# Patient Record
Sex: Male | Born: 1965
Health system: Southern US, Community
[De-identification: ages and names within clinical notes are randomized; demographics above are authoritative.]

## PROBLEM LIST (undated history)

## (undated) DIAGNOSIS — Z8619 Personal history of other infectious and parasitic diseases: Secondary | ICD-10-CM

## (undated) DIAGNOSIS — T7840XA Allergy, unspecified, initial encounter: Secondary | ICD-10-CM

## (undated) HISTORY — DX: Allergy, unspecified, initial encounter: T78.40XA

## (undated) HISTORY — DX: Personal history of other infectious and parasitic diseases: Z86.19

## (undated) HISTORY — PX: TONSILLECTOMY AND ADENOIDECTOMY: SHX28

---

## 2010-01-01 ENCOUNTER — Encounter: Admission: RE | Admit: 2010-01-01 | Discharge: 2010-01-01 | Payer: Self-pay | Admitting: Family Medicine

## 2012-03-12 DIAGNOSIS — C4491 Basal cell carcinoma of skin, unspecified: Secondary | ICD-10-CM | POA: Insufficient documentation

## 2013-07-28 ENCOUNTER — Other Ambulatory Visit: Payer: Self-pay | Admitting: Family Medicine

## 2013-07-28 ENCOUNTER — Ambulatory Visit
Admission: RE | Admit: 2013-07-28 | Discharge: 2013-07-28 | Disposition: A | Discharge status: Home / Self Care | Payer: Self-pay | Source: Ambulatory Visit | Attending: Family Medicine | Admitting: Family Medicine

## 2013-07-28 DIAGNOSIS — T148XXA Other injury of unspecified body region, initial encounter: Secondary | ICD-10-CM

## 2013-07-28 DIAGNOSIS — R52 Pain, unspecified: Secondary | ICD-10-CM

## 2013-07-28 DIAGNOSIS — R609 Edema, unspecified: Secondary | ICD-10-CM

## 2013-11-24 ENCOUNTER — Observation Stay (HOSPITAL_COMMUNITY)
Admission: EM | Admit: 2013-11-24 | Discharge: 2013-11-25 | Disposition: A | Discharge status: Home / Self Care | Payer: BC Managed Care – PPO | Attending: Internal Medicine | Admitting: Internal Medicine

## 2013-11-24 ENCOUNTER — Encounter (HOSPITAL_COMMUNITY): Payer: Self-pay | Admitting: Emergency Medicine

## 2013-11-24 ENCOUNTER — Emergency Department (HOSPITAL_COMMUNITY): Payer: BC Managed Care – PPO

## 2013-11-24 DIAGNOSIS — F1210 Cannabis abuse, uncomplicated: Secondary | ICD-10-CM | POA: Insufficient documentation

## 2013-11-24 DIAGNOSIS — S0081XA Abrasion of other part of head, initial encounter: Secondary | ICD-10-CM | POA: Diagnosis present

## 2013-11-24 DIAGNOSIS — S0003XA Contusion of scalp, initial encounter: Secondary | ICD-10-CM | POA: Insufficient documentation

## 2013-11-24 DIAGNOSIS — Y9389 Activity, other specified: Secondary | ICD-10-CM | POA: Insufficient documentation

## 2013-11-24 DIAGNOSIS — Z87891 Personal history of nicotine dependence: Secondary | ICD-10-CM | POA: Insufficient documentation

## 2013-11-24 DIAGNOSIS — S0083XA Contusion of other part of head, initial encounter: Secondary | ICD-10-CM

## 2013-11-24 DIAGNOSIS — R55 Syncope and collapse: Principal | ICD-10-CM | POA: Diagnosis present

## 2013-11-24 DIAGNOSIS — S1093XA Contusion of unspecified part of neck, initial encounter: Secondary | ICD-10-CM

## 2013-11-24 DIAGNOSIS — W1809XA Striking against other object with subsequent fall, initial encounter: Secondary | ICD-10-CM | POA: Insufficient documentation

## 2013-11-24 DIAGNOSIS — Y92009 Unspecified place in unspecified non-institutional (private) residence as the place of occurrence of the external cause: Secondary | ICD-10-CM | POA: Insufficient documentation

## 2013-11-24 DIAGNOSIS — IMO0002 Reserved for concepts with insufficient information to code with codable children: Secondary | ICD-10-CM | POA: Insufficient documentation

## 2013-11-24 LAB — CBC WITH DIFFERENTIAL/PLATELET
BASOS ABS: 0 10*3/uL (ref 0.0–0.1)
Basophils Relative: 0 % (ref 0–1)
EOS PCT: 3 % (ref 0–5)
Eosinophils Absolute: 0.3 10*3/uL (ref 0.0–0.7)
HEMATOCRIT: 39.2 % (ref 39.0–52.0)
HEMOGLOBIN: 13.6 g/dL (ref 13.0–17.0)
LYMPHS ABS: 2.1 10*3/uL (ref 0.7–4.0)
Lymphocytes Relative: 22 % (ref 12–46)
MCH: 30.9 pg (ref 26.0–34.0)
MCHC: 34.7 g/dL (ref 30.0–36.0)
MCV: 89.1 fL (ref 78.0–100.0)
MONO ABS: 0.8 10*3/uL (ref 0.1–1.0)
MONOS PCT: 8 % (ref 3–12)
NEUTROS ABS: 6.5 10*3/uL (ref 1.7–7.7)
Neutrophils Relative %: 67 % (ref 43–77)
PLATELETS: 235 10*3/uL (ref 150–400)
RBC: 4.4 MIL/uL (ref 4.22–5.81)
RDW: 13.1 % (ref 11.5–15.5)
WBC: 9.7 10*3/uL (ref 4.0–10.5)

## 2013-11-24 LAB — COMPREHENSIVE METABOLIC PANEL
ALK PHOS: 43 U/L (ref 39–117)
ALT: 16 U/L (ref 0–53)
AST: 19 U/L (ref 0–37)
Albumin: 3.7 g/dL (ref 3.5–5.2)
BILIRUBIN TOTAL: 0.5 mg/dL (ref 0.3–1.2)
BUN: 13 mg/dL (ref 6–23)
CALCIUM: 9 mg/dL (ref 8.4–10.5)
CO2: 23 mEq/L (ref 19–32)
CREATININE: 1.09 mg/dL (ref 0.50–1.35)
Chloride: 99 mEq/L (ref 96–112)
GFR calc non Af Amer: 78 mL/min — ABNORMAL LOW (ref >90)
Glucose, Bld: 125 mg/dL — ABNORMAL HIGH (ref 70–99)
POTASSIUM: 3.5 meq/L — AB (ref 3.7–5.3)
Sodium: 140 mEq/L (ref 137–147)
TOTAL PROTEIN: 6 g/dL (ref 6.0–8.3)

## 2013-11-24 LAB — CBG MONITORING, ED: Glucose-Capillary: 102 mg/dL — ABNORMAL HIGH (ref 70–99)

## 2013-11-24 LAB — ETHANOL: Alcohol, Ethyl (B): 63 mg/dL — ABNORMAL HIGH (ref 0–11)

## 2013-11-24 MED ORDER — SODIUM CHLORIDE 0.9 % IV SOLN: 1000.0000 mL | INTRAVENOUS | Status: DC

## 2013-11-24 MED ORDER — SODIUM CHLORIDE 0.9 % IV SOLN
1000.0000 mL | Freq: Once | INTRAVENOUS | Status: AC
  Administered 2013-11-24: 1000 mL via INTRAVENOUS

## 2013-11-24 NOTE — ED Notes (Signed)
Per EMS from home with c/o near syncope versus syncopal episode.  Pt was going to bathroom and fell at home.  Pt sts he may have been out for about a couple of seconds.  Pt hit head when he fell; hematoma above 2 left eye.  Pt was diaphoretic and pale on EMS arrival.  Pt was noted to be orthostatic.  Pt given 500 cc bolus.  VS currently stable.  Pt not complaining of any pain on arrival.

## 2013-11-24 NOTE — ED Provider Notes (Signed)
CSN: [id number]     Arrival date & time 11/24/13  2229 History   First MD Initiated Contact with Patient 11/24/13 2253     Chief Complaint  Patient presents with  . Loss of Consciousness     (Consider location/radiation/quality/duration/timing/severity/associated sxs/prior Treatment) HPI 48 year old male presents to emergency room via EMS from home after a syncopal episode.  Patient reports he has not eaten much today, had a banana for breakfast, herbal tea throughout the day.  He went out for dinner, had his normal meal and had 3 pints of beer.  Upon coming home, he reports he "felt bad.".  He walked to the bathroom to have a bowel movement.  Patient became dizzy and lightheaded and passed out.  Patient vomited after episode of emesis.  Wife reports when she heard him fall, he was unresponsive gray and diaphoretic.  She reports it took several minutes before he was acting normally again.  Patient struck the left side of his face.  He is denying any headache.  Patient denies any significant medical history, no family history, no medications.  He and his wife had a viral syndrome on and off for last 2 weeks.  EMS reports patient was orthostatic upon their arrival, he was given a 500 cc bolus.  History reviewed. No pertinent past medical history. History reviewed. No pertinent past surgical history. History reviewed. No pertinent family history. History  Substance Use Topics  . Smoking status: Former Research scientist (life sciences)  . Smokeless tobacco: Not on file  . Alcohol Use: Yes    Review of Systems  See History of Present Illness; otherwise all other systems are reviewed and negative   Allergies  Review of patient's allergies indicates no known allergies.  Home Medications   Current Outpatient Rx  Name  Route  Sig  Dispense  Refill  . acetaminophen (TYLENOL) 325 MG tablet   Oral   Take 650 mg by mouth every 6 (six) hours as needed.          BP 107/71  Pulse 77 Physical Exam  Nursing note and  vitals reviewed. Constitutional: He is oriented to person, place, and time. He appears well-developed and well-nourished. No distress.  HENT:  Head: Normocephalic and atraumatic.  Right Ear: External ear normal.  Left Ear: External ear normal.  Nose: Nose normal.  Mouth/Throat: Oropharynx is clear and moist.  Contusion below left eyebrow, abrasions to left face  Eyes: Conjunctivae and EOM are normal. Pupils are equal, round, and reactive to light.  Neck: Normal range of motion. Neck supple. No JVD present. No tracheal deviation present. No thyromegaly present.  Cardiovascular: Normal rate, regular rhythm, normal heart sounds and intact distal pulses.  Exam reveals no gallop and no friction rub.   No murmur heard. Pulmonary/Chest: Effort normal and breath sounds normal. No stridor. No respiratory distress. He has no wheezes. He has no rales. He exhibits no tenderness.  Abdominal: Soft. Bowel sounds are normal. He exhibits no distension and no mass. There is no tenderness. There is no rebound and no guarding.  Musculoskeletal: Normal range of motion. He exhibits no edema and no tenderness.  Lymphadenopathy:    He has no cervical adenopathy.  Neurological: He is alert and oriented to person, place, and time. He has normal reflexes. No cranial nerve deficit. He exhibits normal muscle tone. Coordination normal.  Skin: Skin is warm and dry. No rash noted. No erythema. No pallor.  Psychiatric: He has a normal mood and affect. His behavior is normal.  Judgment and thought content normal.    ED Course  Procedures (including critical care time) Labs Review Labs Reviewed  COMPREHENSIVE METABOLIC PANEL - Abnormal; Notable for the following:    Potassium 3.5 (*)    Glucose, Bld 125 (*)    GFR calc non Af Amer 78 (*)    All other components within normal limits  ETHANOL - Abnormal; Notable for the following:    Alcohol, Ethyl (B) 63 (*)    All other components within normal limits  CBG  MONITORING, ED - Abnormal; Notable for the following:    Glucose-Capillary 102 (*)    All other components within normal limits  CBC WITH DIFFERENTIAL  URINALYSIS, ROUTINE W REFLEX MICROSCOPIC  POCT CBG (FASTING - GLUCOSE)-MANUAL ENTRY   Imaging Review Ct Head Wo Contrast  11/25/2013   CLINICAL DATA:  Syncope.  Head injury after fall.  EXAM: CT HEAD WITHOUT CONTRAST  TECHNIQUE: Contiguous axial images were obtained from the base of the skull through the vertex without intravenous contrast.  COMPARISON:  None.  FINDINGS: No mass effect or midline shift is noted. Ventricular size is within normal limits. There is no evidence of mass lesion, hemorrhage or acute infarction. Bony calvarium is intact.  IMPRESSION: No gross intracranial abnormality seen.   Electronically Signed   By: Sabino Dick M.D.   On: 11/25/2013 00:28     EKG Interpretation   Date/Time:  Thursday November 24 2013 22:38:15 EDT Ventricular Rate:  78 PR Interval:  162 QRS Duration: 94 QT Interval:  396 QTC Calculation: 451 R Axis:   90 Text Interpretation:  Sinus rhythm Borderline right axis deviation ST  elev, probable normal early repol pattern No old tracing to compare  Confirmed by Mekaylah Klich  MD, Christol Thetford ([id number]) on 11/24/2013 10:59:07 PM      MDM   Final diagnoses:  Syncope    48 year old male with syncopal episode, minor head injury.  Symptoms seem concerning for possible arrhythmia, differential also includes vasovagal syncope.  Labs, head CT, EKG ordered.    Kalman Drape, MD 11/25/13 [phone]

## 2013-11-25 DIAGNOSIS — IMO0002 Reserved for concepts with insufficient information to code with codable children: Secondary | ICD-10-CM

## 2013-11-25 DIAGNOSIS — S0081XA Abrasion of other part of head, initial encounter: Secondary | ICD-10-CM | POA: Diagnosis present

## 2013-11-25 DIAGNOSIS — R55 Syncope and collapse: Secondary | ICD-10-CM

## 2013-11-25 DIAGNOSIS — I517 Cardiomegaly: Secondary | ICD-10-CM

## 2013-11-25 LAB — PROTIME-INR
INR: 0.98 (ref 0.00–1.49)
PROTHROMBIN TIME: 12.8 s (ref 11.6–15.2)

## 2013-11-25 LAB — URINALYSIS, ROUTINE W REFLEX MICROSCOPIC
Bilirubin Urine: NEGATIVE
Glucose, UA: NEGATIVE mg/dL
Hgb urine dipstick: NEGATIVE
Ketones, ur: NEGATIVE mg/dL
LEUKOCYTES UA: NEGATIVE
NITRITE: NEGATIVE
Protein, ur: NEGATIVE mg/dL
Specific Gravity, Urine: 1.005 (ref 1.005–1.030)
UROBILINOGEN UA: 0.2 mg/dL (ref 0.0–1.0)
pH: 6.5 (ref 5.0–8.0)

## 2013-11-25 LAB — CBC
HCT: 38.6 % — ABNORMAL LOW (ref 39.0–52.0)
Hemoglobin: 13.1 g/dL (ref 13.0–17.0)
MCH: 30.4 pg (ref 26.0–34.0)
MCHC: 33.9 g/dL (ref 30.0–36.0)
MCV: 89.6 fL (ref 78.0–100.0)
PLATELETS: 247 10*3/uL (ref 150–400)
RBC: 4.31 MIL/uL (ref 4.22–5.81)
RDW: 13 % (ref 11.5–15.5)
WBC: 8.7 10*3/uL (ref 4.0–10.5)

## 2013-11-25 LAB — RAPID URINE DRUG SCREEN, HOSP PERFORMED
AMPHETAMINES: NOT DETECTED
BARBITURATES: NOT DETECTED
BENZODIAZEPINES: NOT DETECTED
COCAINE: NOT DETECTED
OPIATES: NOT DETECTED
TETRAHYDROCANNABINOL: POSITIVE — AB

## 2013-11-25 LAB — TROPONIN I

## 2013-11-25 LAB — COMPREHENSIVE METABOLIC PANEL
ALK PHOS: 48 U/L (ref 39–117)
ALT: 15 U/L (ref 0–53)
AST: 17 U/L (ref 0–37)
Albumin: 3.6 g/dL (ref 3.5–5.2)
BILIRUBIN TOTAL: 0.3 mg/dL (ref 0.3–1.2)
BUN: 11 mg/dL (ref 6–23)
CHLORIDE: 107 meq/L (ref 96–112)
CO2: 22 meq/L (ref 19–32)
CREATININE: 0.98 mg/dL (ref 0.50–1.35)
Calcium: 8.7 mg/dL (ref 8.4–10.5)
GLUCOSE: 120 mg/dL — AB (ref 70–99)
POTASSIUM: 4.2 meq/L (ref 3.7–5.3)
Sodium: 143 mEq/L (ref 137–147)
Total Protein: 5.8 g/dL — ABNORMAL LOW (ref 6.0–8.3)

## 2013-11-25 MED ORDER — ENOXAPARIN SODIUM 40 MG/0.4ML ~~LOC~~ SOLN
40.0000 mg | SUBCUTANEOUS | Status: DC
Start: 2013-11-25 — End: 2013-11-25
  Administered 2013-11-25: 40 mg via SUBCUTANEOUS
  Filled 2013-11-25: qty 0.4

## 2013-11-25 MED ORDER — SODIUM CHLORIDE 0.9 % IV SOLN
INTRAVENOUS | Status: DC
  Administered 2013-11-25: 05:00:00 via INTRAVENOUS

## 2013-11-25 MED ORDER — ONDANSETRON HCL 4 MG/2ML IJ SOLN: 4.0000 mg | Freq: Four times a day (QID) | INTRAMUSCULAR | Status: DC | PRN

## 2013-11-25 MED ORDER — ACETAMINOPHEN 325 MG PO TABS: 650.0000 mg | ORAL_TABLET | Freq: Four times a day (QID) | ORAL | Status: DC | PRN

## 2013-11-25 MED ORDER — SODIUM CHLORIDE 0.9 % IJ SOLN
3.0000 mL | Freq: Two times a day (BID) | INTRAMUSCULAR | Status: DC
  Administered 2013-11-25: 3 mL via INTRAVENOUS

## 2013-11-25 MED ORDER — ACETAMINOPHEN 650 MG RE SUPP: 650.0000 mg | Freq: Four times a day (QID) | RECTAL | Status: DC | PRN

## 2013-11-25 MED ORDER — ONDANSETRON HCL 4 MG PO TABS: 4.0000 mg | ORAL_TABLET | Freq: Four times a day (QID) | ORAL | Status: DC | PRN

## 2013-11-25 NOTE — Progress Notes (Signed)
UR completed 

## 2013-11-25 NOTE — Progress Notes (Signed)
  Echocardiogram 2D Echocardiogram has been performed.  Diamond Nickel 11/25/2013, 12:15 PM

## 2013-11-25 NOTE — Discharge Summary (Signed)
Physician Discharge Summary  Blake Brown RWE:[id number] DOB: 1966/08/07 DOA: 11/24/2013  PCP: Rachell Cipro, MD  Admit date: 11/24/2013 Discharge date: 11/25/2013  Discharge Diagnoses:  Principal Problem:   Syncope, likely vasovagal Active Problems: Face contusion.  Discharge Condition: stable.  Filed Weights   11/25/13 0217 11/25/13 0449  Weight: 98.431 kg (217 lb) 98.431 kg (217 lb)    History of present illness:  48 y.o. male with no significant Past medical history.  The patient is coming from home.  The patient presented with an episode of syncope. He mentions he hadn't had anything to eat during the day and later on in the night he went for dinner, he had 3 beer and after eating went to the restroom for a bowel movement, after that when he tried to stand up he felt dizzy and fell on the ground, he does not remember the fall he found himself on the ground and he had an episode of vomiting. He denies any prior fever, chills, headache, cough, chest pain, palpitation, shortness of breath, orthopnea, PND, nausea, vomiting, abdominal pain, diarrhea, constipation, active bleeding, burning urination, dizziness, pedal edema, focal neurological deficit.  He does not have the symptoms at present.  He had injury to his eye on the left and cheek. He denies any blurring of the vision.  Hospital Course:  Observed on telemetry. Given IVF. Echo showed nothing concerning. Likely vasovagal syncope  Procedures:  none  Consultations:  none  Discharge Exam: Filed Vitals:   11/25/13 1320  BP: 129/76  Pulse: 71  Temp: 97.4 F (36.3 C)  Resp: 20    General: alert, oriented, cooperative Cardiovascular: RRR Respiratory: CTA Ext no CCE  Discharge Instructions  Discharge Orders   Future Orders Complete By Expires   Activity as tolerated - No restrictions  As directed    Diet general  As directed        Medication List         acetaminophen 325 MG tablet  Commonly known as:   TYLENOL  Take 650 mg by mouth every 6 (six) hours as needed.       No Known Allergies     Follow-up Information   Follow up with [facility], MD. (As needed)    Specialty:  Family Medicine   Contact information:   [place] [id number] [phone]        The results of significant diagnostics from this hospitalization (including imaging, microbiology, ancillary and laboratory) are listed below for reference.    Significant Diagnostic Studies: Ct Head Wo Contrast  11/25/2013   CLINICAL DATA:  Syncope.  Head injury after fall.  EXAM: CT HEAD WITHOUT CONTRAST  TECHNIQUE: Contiguous axial images were obtained from the base of the skull through the vertex without intravenous contrast.  COMPARISON:  None.  FINDINGS: No mass effect or midline shift is noted. Ventricular size is within normal limits. There is no evidence of mass lesion, hemorrhage or acute infarction. Bony calvarium is intact.  IMPRESSION: No gross intracranial abnormality seen.   Electronically Signed   By: Sabino Dick M.D.   On: 11/25/2013 00:28   EKG NSR  Echo Left ventricle: The cavity size was normal. Wall thickness was increased in a pattern of mild LVH. Systolic function was normal. The estimated ejection fraction was in the range of 60% to 65%. Wall motion was normal; there were no regional wall motion abnormalities. Doppler parameters are consistent with abnormal left ventricular relaxation (grade 1 diastolic  dysfunction). - Right atrium: Central venous pressure: 24mm Hg (est). - Atrial septum: No defect or patent foramen ovale was identified. - Pulmonary arteries: Systolic pressure could not be accurately estimated. - Pericardium, extracardiac: There was no pericardial effusion. Impressions:  - Mild LVH with LVEF 02-77%, grade 1 diastolic dysfunction. No significant valvular abnormalities. Unable to assess PASP. No pericardial effusion.  Microbiology: No results found  for this or any previous visit (from the past 240 hour(s)).   Labs: Basic Metabolic Panel:  Recent Labs Lab 11/24/13 2244 11/25/13 0451  NA 140 143  K 3.5* 4.2  CL 99 107  CO2 23 22  GLUCOSE 125* 120*  BUN 13 11  CREATININE 1.09 0.98  CALCIUM 9.0 8.7   Liver Function Tests:  Recent Labs Lab 11/24/13 2244 11/25/13 0451  AST 19 17  ALT 16 15  ALKPHOS 43 48  BILITOT 0.5 0.3  PROT 6.0 5.8*  ALBUMIN 3.7 3.6   No results found for this basename: LIPASE, AMYLASE,  in the last 168 hours No results found for this basename: AMMONIA,  in the last 168 hours CBC:  Recent Labs Lab 11/24/13 2244 11/25/13 0451  WBC 9.7 8.7  NEUTROABS 6.5  --   HGB 13.6 13.1  HCT 39.2 38.6*  MCV 89.1 89.6  PLT 235 247   Cardiac Enzymes:  Recent Labs Lab 11/25/13 0130 11/25/13 0920  TROPONINI <0.30 <0.30   BNP: BNP (last 3 results) No results found for this basename: PROBNP,  in the last 8760 hours CBG:  Recent Labs Lab 11/24/13 2300  GLUCAP 102*       Signed:  [place] L  [facility] 11/25/2013, 2:33 PM

## 2013-11-25 NOTE — Progress Notes (Signed)
Discharge review done with patient.   Patient acknowledged understanding of information provided. Patient is stable and discharged home with family member. Blake Brown  

## 2013-11-25 NOTE — H&P (Signed)
[facility] History and Physical  Patient: Blake Brown  EVO:[id number]  DOB: Jul 09, 1966  DOS: the patient was seen and examined on 11/25/2013 PCP: Rachell Cipro, MD  Chief Complaint: fall  HPI: Blake Brown is a 48 y.o. male with no significant Past medical history. The patient is coming from home. The patient presented with an episode of syncope. He mentions he hadn't had anything to eat during the day and later on in the night he went for dinner, he had 3 beer and after eating went to the restroom for a bowel movement, after that when he tried to stand up he felt dizzy and fell on the ground, he does not remember the fall he found himself on the ground and he had an episode of vomiting. He denies any prior fever, chills, headache, cough, chest pain, palpitation, shortness of breath, orthopnea, PND, nausea, vomiting, abdominal pain, diarrhea, constipation, active bleeding, burning urination, dizziness, pedal edema,  focal neurological deficit.  He does not have the symptoms at present. He had injury to his eye on the left and cheek. He denies any blurring of the vision.  Review of Systems: as mentioned in the history of present illness.  A Comprehensive review of the other systems is negative.  History reviewed. No pertinent past medical history. History reviewed. No pertinent past surgical history. Social History:  reports that he has quit smoking. He does not have any smokeless tobacco history on file. He reports that he drinks alcohol. His drug history is not on file. Independent for most of his  ADL.  No Known Allergies  History reviewed. No pertinent family history.  Prior to Admission medications   Medication Sig Start Date End Date Taking? Authorizing Provider  acetaminophen (TYLENOL) 325 MG tablet Take 650 mg by mouth every 6 (six) hours as needed.   Yes Historical Provider, MD    Physical Exam: Filed Vitals:   11/24/13 2254 11/24/13 2256 11/24/13 2257 11/25/13  0217  BP: 94/55 100/65 107/71 126/71  Pulse: 69 79 77 75  Temp:    97.5 F (36.4 C)  TempSrc:    Oral  Resp:    18  Height:    6\' 3"  (1.905 m)  Weight:    98.431 kg (217 lb)  SpO2:    97%    General: Alert, Awake and Oriented to Time, Place and Person. Appear in mild distress Eyes: PERRL ENT: Oral Mucosa clear moist. Neck: no JVD Cardiovascular: S1 and S2 Present, no Murmur, Peripheral Pulses Present Respiratory: Bilateral Air entry equal and Decreased, Clear to Auscultation,  no Crackles,no wheezes Abdomen: Bowel Sound Present, Soft and Non tender Skin: no Rash Extremities: no Pedal edema, no calf tenderness Neurologic: Grossly Unremarkable. Labs on Admission:  CBC:  Recent Labs Lab 11/24/13 2244  WBC 9.7  NEUTROABS 6.5  HGB 13.6  HCT 39.2  MCV 89.1  PLT 235    CMP     Component Value Date/Time   NA 140 11/24/2013 2244   K 3.5* 11/24/2013 2244   CL 99 11/24/2013 2244   CO2 23 11/24/2013 2244   GLUCOSE 125* 11/24/2013 2244   BUN 13 11/24/2013 2244   CREATININE 1.09 11/24/2013 2244   CALCIUM 9.0 11/24/2013 2244   PROT 6.0 11/24/2013 2244   ALBUMIN 3.7 11/24/2013 2244   AST 19 11/24/2013 2244   ALT 16 11/24/2013 2244   ALKPHOS 43 11/24/2013 2244   BILITOT 0.5 11/24/2013 2244   GFRNONAA 78* 11/24/2013 2244   GFRAA >90  11/24/2013 2244    No results found for this basename: LIPASE, AMYLASE,  in the last 168 hours No results found for this basename: AMMONIA,  in the last 168 hours   Recent Labs Lab 11/25/13 0130  TROPONINI <0.30   BNP (last 3 results) No results found for this basename: PROBNP,  in the last 8760 hours  Radiological Exams on Admission: Ct Head Wo Contrast  11/25/2013   CLINICAL DATA:  Syncope.  Head injury after fall.  EXAM: CT HEAD WITHOUT CONTRAST  TECHNIQUE: Contiguous axial images were obtained from the base of the skull through the vertex without intravenous contrast.  COMPARISON:  None.  FINDINGS: No mass effect or midline shift is noted.  Ventricular size is within normal limits. There is no evidence of mass lesion, hemorrhage or acute infarction. Bony calvarium is intact.  IMPRESSION: No gross intracranial abnormality seen.   Electronically Signed   By: Sabino Dick M.D.   On: 11/25/2013 00:28    EKG: Independently reviewed. normal EKG, normal sinus rhythm, there are no previous tracings available for comparison.  Assessment/Plan Principal Problem:   Syncope Active Problems:   Abrasion, face without infection   1. Syncope The patient is presenting an episode of syncope. Most likely this appear to be situational but he would be admitted to the hospital for further workup. EKG does not show any significant vomiting and lab work also appears within normal limits. He does not have any residual symptoms at present. He would be observed on telemetry I will get echocardiogram and serial troponins. Serial neuro checks. CT of the head is negative for any acute abnormality.  2. Fascial abrasion No significant open wound Continue to monitor CT of the head is negative for any acute trauma  DVT Prophylaxis: subcutaneous Heparin Nutrition: cardiac diet  Code Status: full  Family Communication: family was present at bedside, opportunity was given to ask question and all questions were answered satisfactorily at the time of interview. Disposition: Admitted to observation in telemetry unit.  Author: Berle Mull, MD [facility] Pager: [phone] 11/25/2013, 3:28 AM    If 7PM-7AM, please contact night-coverage [email or web address] Password [email or web address]

## 2014-03-12 LAB — HM COLONOSCOPY

## 2014-05-05 IMAGING — CT CT HEAD W/O CM
1 series · 16 of 30 positions shown, 20 images · non-contrast
Comparison: None.

CLINICAL DATA: Syncope.  Head injury after fall.

EXAM:
CT HEAD WITHOUT CONTRAST
TECHNIQUE: Contiguous axial images were obtained from the base of the skull
through the vertex without intravenous contrast.

[Series 2: head 5.0 h30s · axial · 0.47mm/px · z∈[-120,+20]mm · 16 of 32 slices shown, 20 images]
[im 2/32  brain]
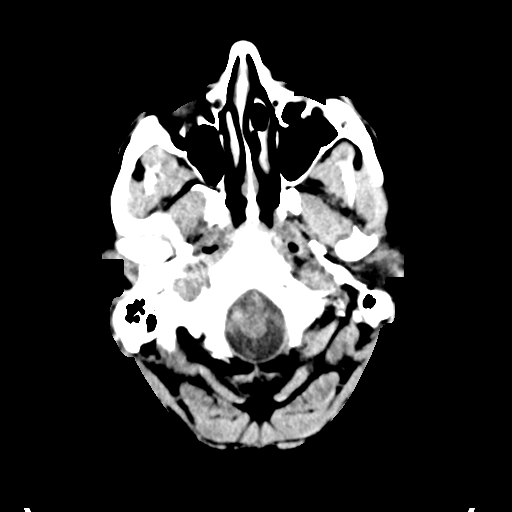
[im 2/32  bone]
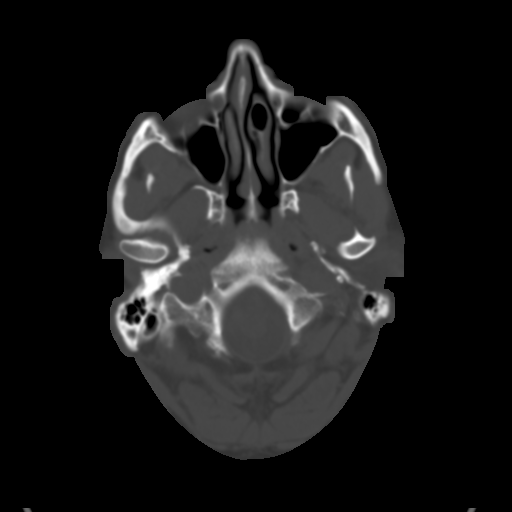
[im 4/32  brain]
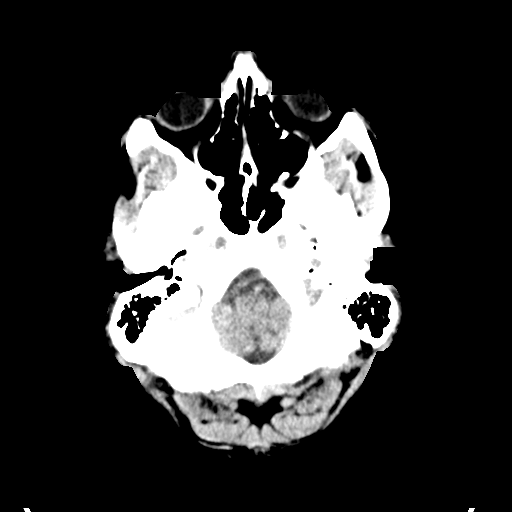
[im 6/32  brain]
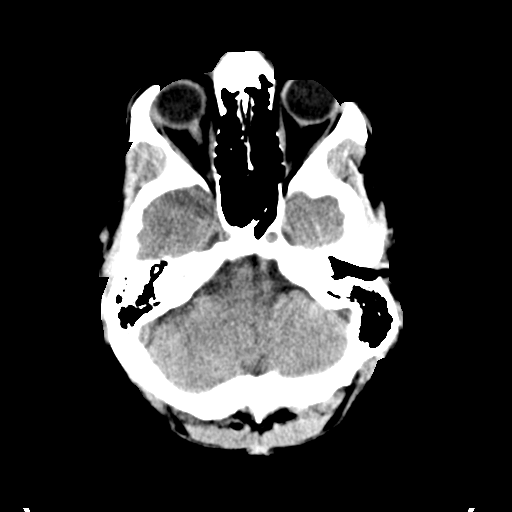
[im 8/32  brain]
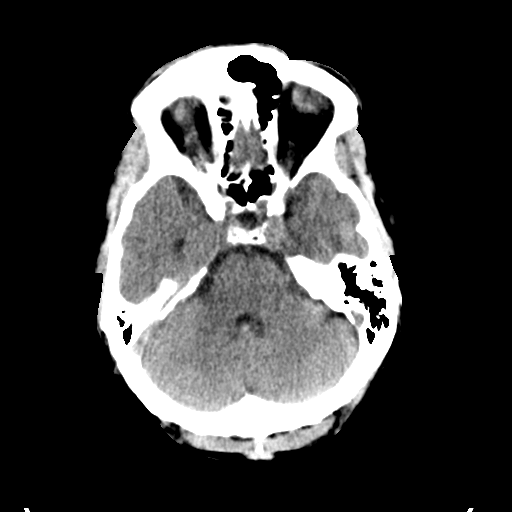
[im 9/32  brain]
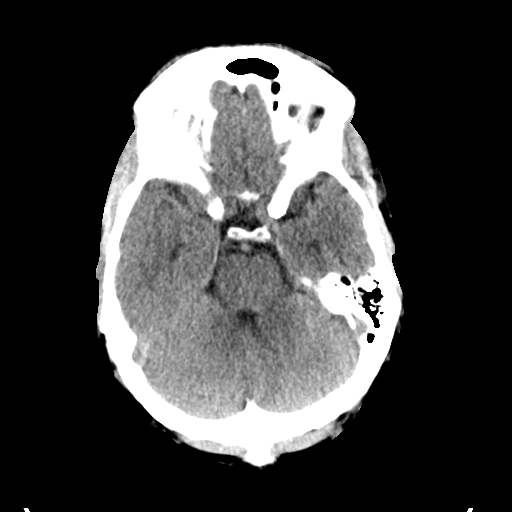
[im 9/32  bone]
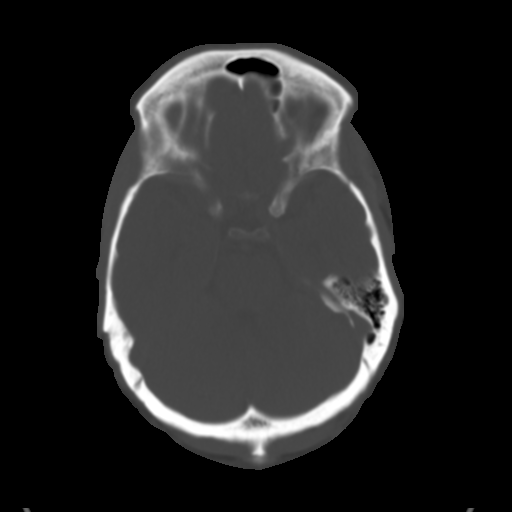
[im 11/32  brain]
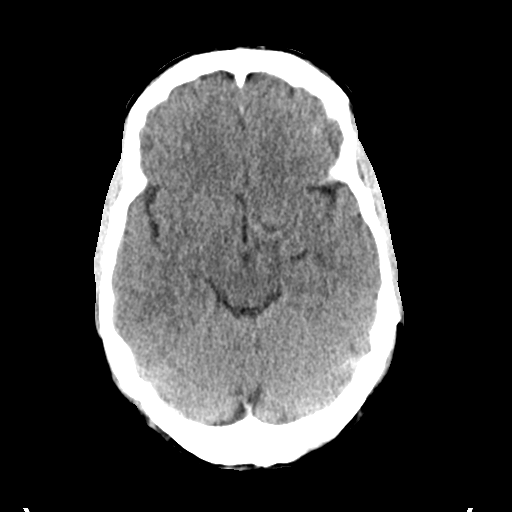
[im 13/32  brain]
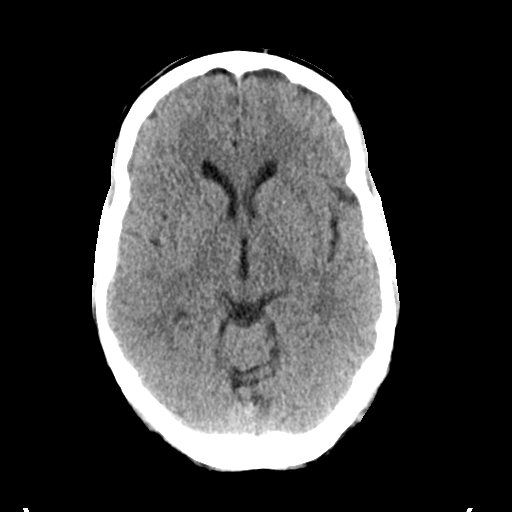
[im 15/32  brain]
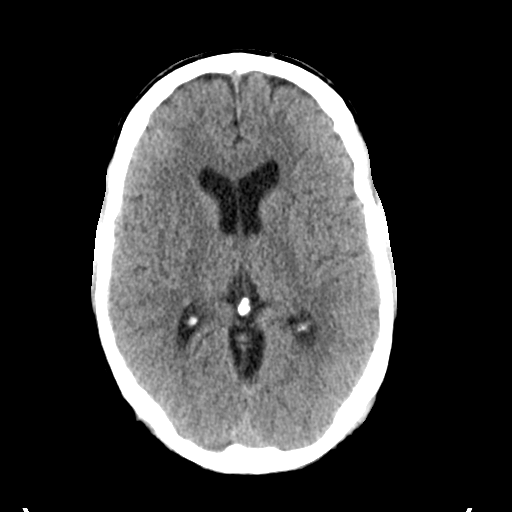
[im 17/32  brain]
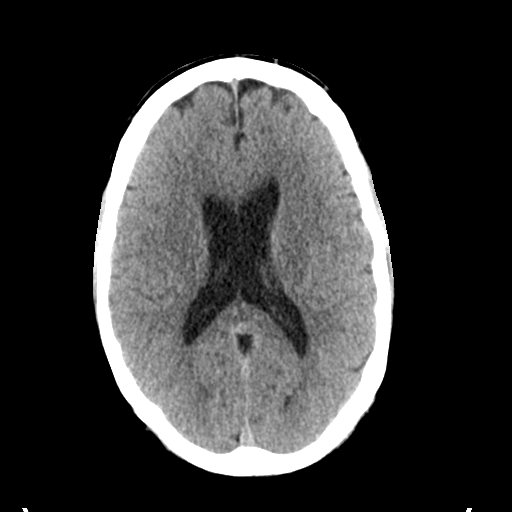
[im 17/32  bone]
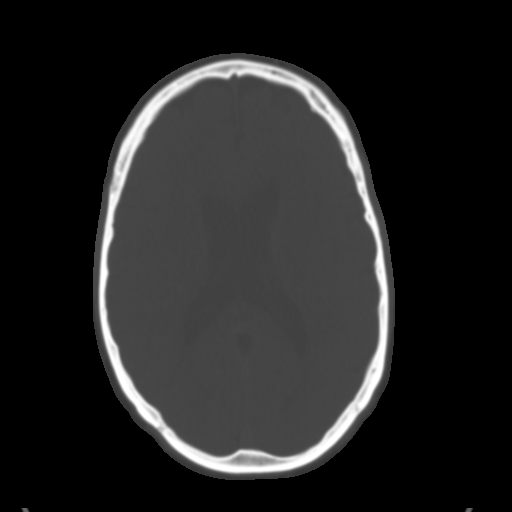
[im 19/32  brain]
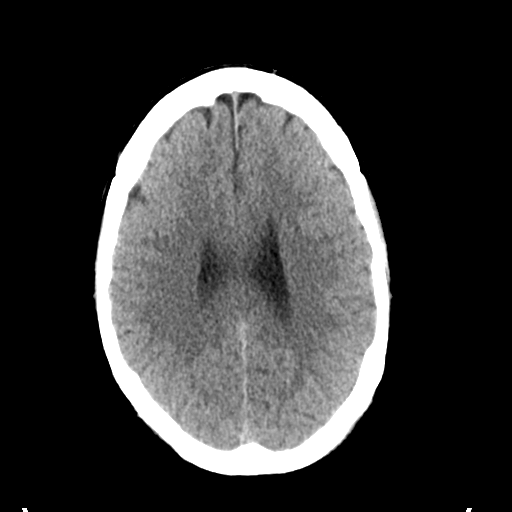
[im 21/32  brain]
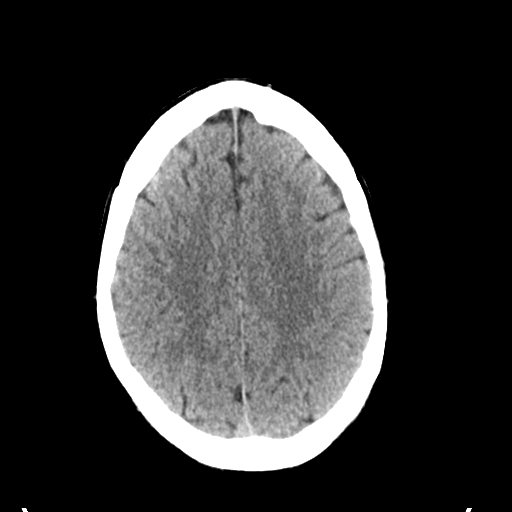
[im 23/32  brain]
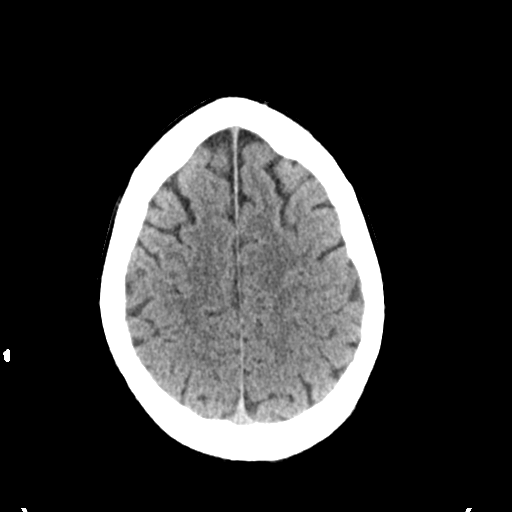
[im 24/32  brain]
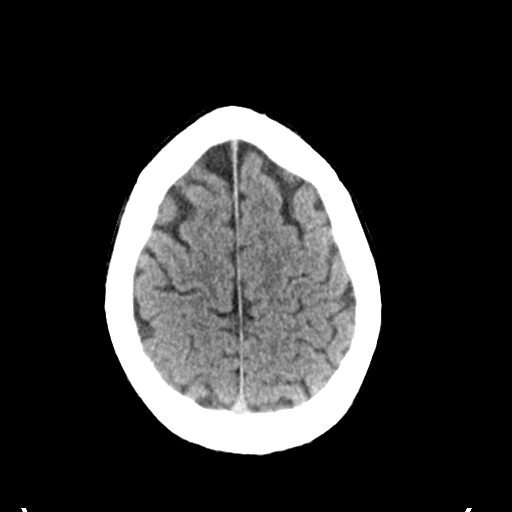
[im 24/32  bone]
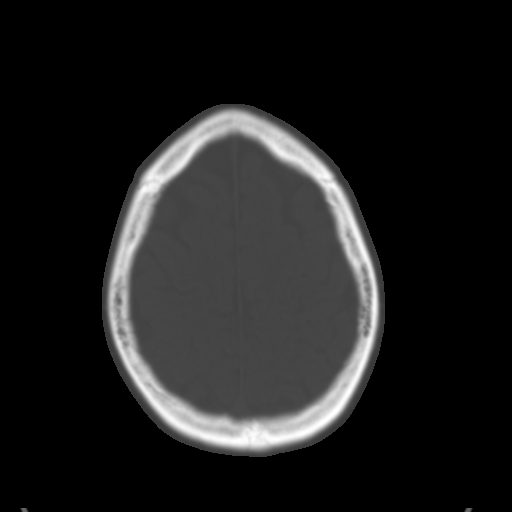
[im 26/32  brain]
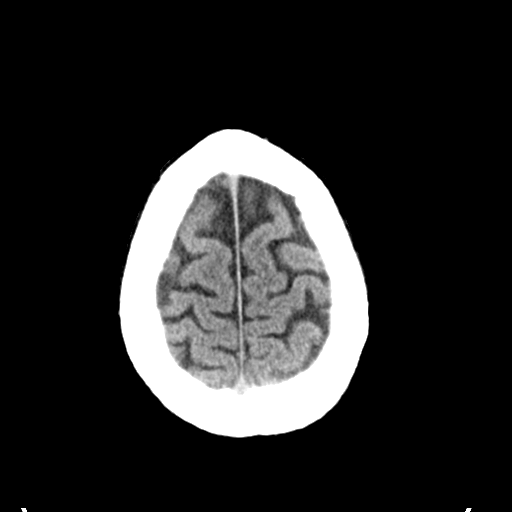
[im 28/32  brain]
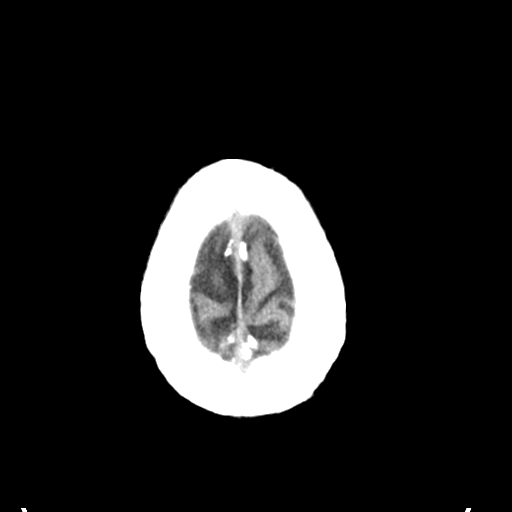
[im 30/32  brain]
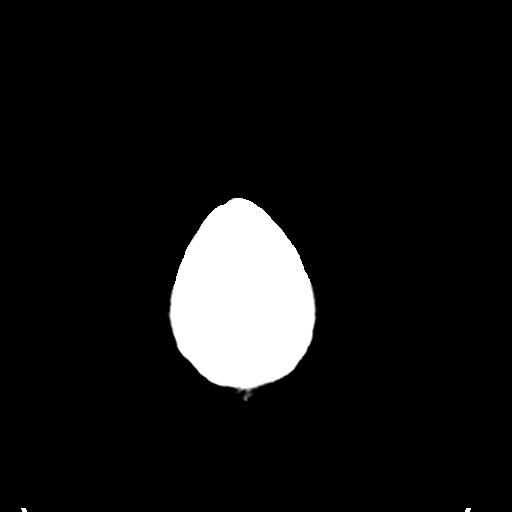

[16 of 30 positions shown; findings below may reference images not displayed]

FINDINGS: No mass effect or midline shift is noted. Ventricular size is within
normal limits. There is no evidence of mass lesion, hemorrhage or
acute infarction. Bony calvarium is intact.
IMPRESSION: No gross intracranial abnormality seen.

## 2017-03-12 ENCOUNTER — Ambulatory Visit (INDEPENDENT_AMBULATORY_CARE_PROVIDER_SITE_OTHER): Payer: BLUE CROSS/BLUE SHIELD | Admitting: Family Medicine

## 2017-03-12 ENCOUNTER — Encounter: Payer: Self-pay | Admitting: Family Medicine

## 2017-03-12 DIAGNOSIS — E663 Overweight: Secondary | ICD-10-CM | POA: Diagnosis not present

## 2017-03-12 NOTE — Progress Notes (Signed)
   Subjective:    Patient ID: FELTON BUCZYNSKI, male    DOB: 07-08-1966, 51 y.o.   MRN: [id number]  HPI New to establish.  Previous MD- Ernie Hew  Last CPE 01/13/17.  Had blood work done.  UTD on colonoscopy- Dr Collene Mares  Overweight- exercising regularly, walking 2-3 miles daily.  Pt is not following a particular diet.  No CP, SOB, HAs, visual changes, edema, abd pain, N/V.  Health maintenance- Pt has had 2/3 Hep B shots (due in December), completed Hep A.  Had 1 of 2 Shingrix (already had Zostavax)   Review of Systems For ROS see HPI     Objective:   Physical Exam  Constitutional: He is oriented to person, place, and time. He appears well-developed and well-nourished. No distress.  HENT:  Head: Normocephalic and atraumatic.  Eyes: Conjunctivae and EOM are normal. Pupils are equal, round, and reactive to light.  Neck: Normal range of motion. Neck supple. No thyromegaly present.  Cardiovascular: Normal rate, regular rhythm, normal heart sounds and intact distal pulses.   No murmur heard. Pulmonary/Chest: Effort normal and breath sounds normal. No respiratory distress.  Abdominal: Soft. Bowel sounds are normal. He exhibits no distension.  Musculoskeletal: He exhibits no edema.  Lymphadenopathy:    He has no cervical adenopathy.  Neurological: He is alert and oriented to person, place, and time. No cranial nerve deficit.  Skin: Skin is warm and dry.  Psychiatric: He has a normal mood and affect. His behavior is normal.  Vitals reviewed.         Assessment & Plan:

## 2017-03-12 NOTE — Assessment & Plan Note (Signed)
New.  Pt is exercising daily but not following any particular diet.  Stressed need for both.  Pt had recent CPE (last month) so will not repeat labs today.  Will review records once they are available to determine if pt needs Tdap or other health maintenance.  Pt expressed understanding and is in agreement w/ plan.

## 2017-03-12 NOTE — Patient Instructions (Signed)
Schedule your complete physical for May Continue to work on healthy diet and regular exercise- you look great! Call with any questions or concerns Welcome!  We're glad to have you!!! Happy 4th!!

## 2017-03-12 NOTE — Progress Notes (Signed)
Pre visit review using our clinic review tool, if applicable. No additional management support is needed unless otherwise documented below in the visit note. 

## 2017-07-22 ENCOUNTER — Ambulatory Visit (INDEPENDENT_AMBULATORY_CARE_PROVIDER_SITE_OTHER): Payer: 59

## 2017-07-22 DIAGNOSIS — Z23 Encounter for immunization: Secondary | ICD-10-CM

## 2017-08-17 ENCOUNTER — Telehealth: Payer: Self-pay | Admitting: Family Medicine

## 2017-08-17 NOTE — Telephone Encounter (Signed)
Agree.  Needs to make a nurse visit appt for 3rd Hep B and 2nd Shingrix

## 2017-08-17 NOTE — Telephone Encounter (Signed)
Reviewed pt immunization records. It appears he needs a second shingrix and a 3rd hep b. Updated in chart for PCP review.

## 2017-08-17 NOTE — Telephone Encounter (Signed)
Blake Brown came in to bring [place] records of immunizations he has received. He noted that he is still in need of part 2 of both the Hep A and Shingles vaccines, but wasn't sure when to schedule the appointment to receive them because he was unsure if a certain amount of time needed to pass first. Pt asked that the document dropped off today (in bin up front) be reviewed and he be contacted to make the follow up appointment.

## 2017-08-18 NOTE — Telephone Encounter (Signed)
LMOVM for Pt to call back and schedule. CRM entered. ED

## 2017-08-18 NOTE — Telephone Encounter (Signed)
Can you schedule pt? 

## 2017-08-20 ENCOUNTER — Ambulatory Visit (INDEPENDENT_AMBULATORY_CARE_PROVIDER_SITE_OTHER): Payer: 59 | Admitting: Emergency Medicine

## 2017-08-20 DIAGNOSIS — Z23 Encounter for immunization: Secondary | ICD-10-CM | POA: Diagnosis not present

## 2017-08-20 NOTE — Progress Notes (Signed)
Patient is here for 3rd Hep B and 2nd Shingrix vaccines. Patient tolerated injections well

## 2018-01-18 ENCOUNTER — Encounter: Payer: BLUE CROSS/BLUE SHIELD | Admitting: Family Medicine

## 2018-06-03 ENCOUNTER — Encounter: Payer: 59 | Admitting: Family Medicine

## 2018-06-07 ENCOUNTER — Ambulatory Visit (INDEPENDENT_AMBULATORY_CARE_PROVIDER_SITE_OTHER): Payer: 59 | Admitting: Family Medicine

## 2018-06-07 ENCOUNTER — Encounter: Payer: Self-pay | Admitting: Family Medicine

## 2018-06-07 ENCOUNTER — Other Ambulatory Visit: Payer: Self-pay

## 2018-06-07 VITALS — BP 110/80 | HR 50 | Temp 98.0°F | Resp 16 | Ht 75.0 in | Wt 226.0 lb

## 2018-06-07 DIAGNOSIS — E663 Overweight: Secondary | ICD-10-CM

## 2018-06-07 DIAGNOSIS — Z23 Encounter for immunization: Secondary | ICD-10-CM | POA: Diagnosis not present

## 2018-06-07 DIAGNOSIS — Z0000 Encounter for general adult medical examination without abnormal findings: Secondary | ICD-10-CM

## 2018-06-07 DIAGNOSIS — Z125 Encounter for screening for malignant neoplasm of prostate: Secondary | ICD-10-CM

## 2018-06-07 LAB — CBC WITH DIFFERENTIAL/PLATELET
BASOS ABS: 0.1 10*3/uL (ref 0.0–0.1)
Basophils Relative: 1 % (ref 0.0–3.0)
EOS PCT: 3.2 % (ref 0.0–5.0)
Eosinophils Absolute: 0.2 10*3/uL (ref 0.0–0.7)
HCT: 45 % (ref 39.0–52.0)
HEMOGLOBIN: 15.4 g/dL (ref 13.0–17.0)
Lymphocytes Relative: 31.4 % (ref 12.0–46.0)
Lymphs Abs: 2 10*3/uL (ref 0.7–4.0)
MCHC: 34.2 g/dL (ref 30.0–36.0)
MCV: 89.5 fl (ref 78.0–100.0)
Monocytes Absolute: 0.5 10*3/uL (ref 0.1–1.0)
Monocytes Relative: 7.1 % (ref 3.0–12.0)
Neutro Abs: 3.7 10*3/uL (ref 1.4–7.7)
Neutrophils Relative %: 57.3 % (ref 43.0–77.0)
Platelets: 248 10*3/uL (ref 150.0–400.0)
RBC: 5.02 Mil/uL (ref 4.22–5.81)
RDW: 13.2 % (ref 11.5–15.5)
WBC: 6.5 10*3/uL (ref 4.0–10.5)

## 2018-06-07 LAB — HEPATIC FUNCTION PANEL
ALBUMIN: 4.8 g/dL (ref 3.5–5.2)
ALT: 15 U/L (ref 0–53)
AST: 16 U/L (ref 0–37)
Alkaline Phosphatase: 48 U/L (ref 39–117)
Bilirubin, Direct: 0.1 mg/dL (ref 0.0–0.3)
TOTAL PROTEIN: 7.1 g/dL (ref 6.0–8.3)
Total Bilirubin: 0.8 mg/dL (ref 0.2–1.2)

## 2018-06-07 LAB — LIPID PANEL
Cholesterol: 205 mg/dL — ABNORMAL HIGH (ref 0–200)
HDL: 62.8 mg/dL (ref >39.00)
NONHDL: 142.41
Total CHOL/HDL Ratio: 3
Triglycerides: 213 mg/dL — ABNORMAL HIGH (ref 0.0–149.0)
VLDL: 42.6 mg/dL — AB (ref 0.0–40.0)

## 2018-06-07 LAB — BASIC METABOLIC PANEL
BUN: 12 mg/dL (ref 6–23)
CALCIUM: 9.7 mg/dL (ref 8.4–10.5)
CO2: 29 mEq/L (ref 19–32)
Chloride: 104 mEq/L (ref 96–112)
Creatinine, Ser: 1.04 mg/dL (ref 0.40–1.50)
GFR: 78.96 mL/min (ref >60.00)
GLUCOSE: 100 mg/dL — AB (ref 70–99)
Potassium: 3.9 mEq/L (ref 3.5–5.1)
Sodium: 141 mEq/L (ref 135–145)

## 2018-06-07 LAB — PSA: PSA: 0.31 ng/mL (ref 0.10–4.00)

## 2018-06-07 LAB — LDL CHOLESTEROL, DIRECT: Direct LDL: 118 mg/dL

## 2018-06-07 LAB — TSH: TSH: 1.18 u[IU]/mL (ref 0.35–4.50)

## 2018-06-07 NOTE — Assessment & Plan Note (Signed)
Pt continues to exercise regularly.  Applauded his efforts.  Check labs to risk stratify.  Will follow.

## 2018-06-07 NOTE — Assessment & Plan Note (Signed)
Pt's PE WNL.  UTD on colonoscopy, shingles vaccines, Tdap.  Flu shot given today.  Check labs.  Anticipatory guidance provided.

## 2018-06-07 NOTE — Patient Instructions (Signed)
Follow up in 1 year or as needed We'll notify you of your lab results and make any changes if needed Continue to work on healthy diet and regular exercise- you look great! Call with any questions or concerns Happy Fall!!

## 2018-06-07 NOTE — Progress Notes (Signed)
   Subjective:    Patient ID: Blake Brown, male    DOB: 1966-05-17, 52 y.o.   MRN: [id number]  HPI CPE- UTD on colonoscopy, Tdap, shingles vaccines.  Will get flu today.   Review of Systems Patient reports no vision/hearing changes, anorexia, fever ,adenopathy, persistant/recurrent hoarseness, swallowing issues, chest pain, palpitations, edema, persistant/recurrent cough, hemoptysis, dyspnea (rest,exertional, paroxysmal nocturnal), gastrointestinal  bleeding (melena, rectal bleeding), abdominal pain, excessive heart burn, GU symptoms (dysuria, hematuria, voiding/incontinence issues) syncope, focal weakness, memory loss, numbness & tingling, skin/hair/nail changes, depression, anxiety, abnormal bruising/bleeding, musculoskeletal symptoms/signs.     Objective:   Physical Exam BP 110/80   Pulse (!) 50   Temp 98 F (36.7 C) (Oral)   Resp 16   Ht 6\' 3"  (1.905 m)   Wt 226 lb (102.5 kg)   SpO2 98%   BMI 28.25 kg/m   General Appearance:    Alert, cooperative, no distress, appears stated age  Head:    Normocephalic, without obvious abnormality, atraumatic  Eyes:    PERRL, conjunctiva/corneas clear, EOM's intact, fundi    benign, both eyes       Ears:    Normal TM's and external ear canals, both ears  Nose:   Nares normal, septum midline, mucosa normal, no drainage   or sinus tenderness  Throat:   Lips, mucosa, and tongue normal; teeth and gums normal  Neck:   Supple, symmetrical, trachea midline, no adenopathy;       thyroid:  No enlargement/tenderness/nodules  Back:     Symmetric, no curvature, ROM normal, no CVA tenderness  Lungs:     Clear to auscultation bilaterally, respirations unlabored  Chest wall:    No tenderness or deformity  Heart:    Regular rate and rhythm, S1 and S2 normal, no murmur, rub   or gallop  Abdomen:     Soft, non-tender, bowel sounds active all four quadrants,    no masses, no organomegaly  Genitalia:    Normal male without lesion, discharge or tenderness    Rectal:    Normal tone, normal prostate, no masses or tenderness  Extremities:   Extremities normal, atraumatic, no cyanosis or edema  Pulses:   2+ and symmetric all extremities  Skin:   Skin color, texture, turgor normal, no rashes or lesions  Lymph nodes:   Cervical, supraclavicular, and axillary nodes normal  Neurologic:   CNII-XII intact. Normal strength, sensation and reflexes      throughout          Assessment & Plan:

## 2018-06-08 ENCOUNTER — Encounter: Payer: Self-pay | Admitting: General Practice

## 2018-09-24 DIAGNOSIS — L821 Other seborrheic keratosis: Secondary | ICD-10-CM | POA: Diagnosis not present

## 2018-09-24 DIAGNOSIS — Z85828 Personal history of other malignant neoplasm of skin: Secondary | ICD-10-CM | POA: Diagnosis not present

## 2018-09-24 DIAGNOSIS — D2271 Melanocytic nevi of right lower limb, including hip: Secondary | ICD-10-CM | POA: Diagnosis not present

## 2018-09-24 DIAGNOSIS — D2261 Melanocytic nevi of right upper limb, including shoulder: Secondary | ICD-10-CM | POA: Diagnosis not present

## 2018-09-24 DIAGNOSIS — D2262 Melanocytic nevi of left upper limb, including shoulder: Secondary | ICD-10-CM | POA: Diagnosis not present

## 2018-09-24 DIAGNOSIS — D225 Melanocytic nevi of trunk: Secondary | ICD-10-CM | POA: Diagnosis not present

## 2018-09-24 DIAGNOSIS — D485 Neoplasm of uncertain behavior of skin: Secondary | ICD-10-CM | POA: Diagnosis not present

## 2018-09-24 DIAGNOSIS — D2272 Melanocytic nevi of left lower limb, including hip: Secondary | ICD-10-CM | POA: Diagnosis not present

## 2018-09-24 DIAGNOSIS — D1801 Hemangioma of skin and subcutaneous tissue: Secondary | ICD-10-CM | POA: Diagnosis not present

## 2018-10-21 DIAGNOSIS — D485 Neoplasm of uncertain behavior of skin: Secondary | ICD-10-CM | POA: Diagnosis not present

## 2018-10-21 DIAGNOSIS — L988 Other specified disorders of the skin and subcutaneous tissue: Secondary | ICD-10-CM | POA: Diagnosis not present

## 2018-10-21 DIAGNOSIS — Z85828 Personal history of other malignant neoplasm of skin: Secondary | ICD-10-CM | POA: Diagnosis not present

## 2019-06-09 ENCOUNTER — Encounter: Payer: 59 | Admitting: Family Medicine

## 2019-06-10 DIAGNOSIS — H5213 Myopia, bilateral: Secondary | ICD-10-CM | POA: Diagnosis not present

## 2019-06-10 DIAGNOSIS — H52203 Unspecified astigmatism, bilateral: Secondary | ICD-10-CM | POA: Diagnosis not present

## 2019-06-14 ENCOUNTER — Encounter: Payer: Self-pay | Admitting: Family Medicine

## 2019-06-14 ENCOUNTER — Other Ambulatory Visit: Payer: Self-pay

## 2019-06-14 ENCOUNTER — Ambulatory Visit (INDEPENDENT_AMBULATORY_CARE_PROVIDER_SITE_OTHER): Payer: 59 | Admitting: Family Medicine

## 2019-06-14 VITALS — BP 121/81 | HR 76 | Temp 98.1°F | Resp 16 | Ht 75.0 in | Wt 221.2 lb

## 2019-06-14 DIAGNOSIS — I499 Cardiac arrhythmia, unspecified: Secondary | ICD-10-CM | POA: Diagnosis not present

## 2019-06-14 DIAGNOSIS — Z0000 Encounter for general adult medical examination without abnormal findings: Secondary | ICD-10-CM | POA: Diagnosis not present

## 2019-06-14 DIAGNOSIS — Z125 Encounter for screening for malignant neoplasm of prostate: Secondary | ICD-10-CM

## 2019-06-14 DIAGNOSIS — Z23 Encounter for immunization: Secondary | ICD-10-CM

## 2019-06-14 DIAGNOSIS — E663 Overweight: Secondary | ICD-10-CM | POA: Diagnosis not present

## 2019-06-14 LAB — CBC WITH DIFFERENTIAL/PLATELET
Basophils Absolute: 0.1 10*3/uL (ref 0.0–0.1)
Basophils Relative: 1 % (ref 0.0–3.0)
Eosinophils Absolute: 0.3 10*3/uL (ref 0.0–0.7)
Eosinophils Relative: 4 % (ref 0.0–5.0)
HCT: 45.8 % (ref 39.0–52.0)
Hemoglobin: 15.6 g/dL (ref 13.0–17.0)
Lymphocytes Relative: 30.5 % (ref 12.0–46.0)
Lymphs Abs: 1.9 10*3/uL (ref 0.7–4.0)
MCHC: 34.1 g/dL (ref 30.0–36.0)
MCV: 91.6 fl (ref 78.0–100.0)
Monocytes Absolute: 0.5 10*3/uL (ref 0.1–1.0)
Monocytes Relative: 8.1 % (ref 3.0–12.0)
Neutro Abs: 3.6 10*3/uL (ref 1.4–7.7)
Neutrophils Relative %: 56.4 % (ref 43.0–77.0)
Platelets: 228 10*3/uL (ref 150.0–400.0)
RBC: 5 Mil/uL (ref 4.22–5.81)
RDW: 13.6 % (ref 11.5–15.5)
WBC: 6.4 10*3/uL (ref 4.0–10.5)

## 2019-06-14 LAB — HEPATIC FUNCTION PANEL
ALT: 17 U/L (ref 0–53)
AST: 19 U/L (ref 0–37)
Albumin: 4.9 g/dL (ref 3.5–5.2)
Alkaline Phosphatase: 49 U/L (ref 39–117)
Bilirubin, Direct: 0.1 mg/dL (ref 0.0–0.3)
Total Bilirubin: 1 mg/dL (ref 0.2–1.2)
Total Protein: 7 g/dL (ref 6.0–8.3)

## 2019-06-14 LAB — LIPID PANEL
Cholesterol: 228 mg/dL — ABNORMAL HIGH (ref 0–200)
HDL: 68.6 mg/dL (ref >39.00)
NonHDL: 159.05
Total CHOL/HDL Ratio: 3
Triglycerides: 209 mg/dL — ABNORMAL HIGH (ref 0.0–149.0)
VLDL: 41.8 mg/dL — ABNORMAL HIGH (ref 0.0–40.0)

## 2019-06-14 LAB — BASIC METABOLIC PANEL
BUN: 12 mg/dL (ref 6–23)
CO2: 25 mEq/L (ref 19–32)
Calcium: 9.9 mg/dL (ref 8.4–10.5)
Chloride: 104 mEq/L (ref 96–112)
Creatinine, Ser: 1.02 mg/dL (ref 0.40–1.50)
GFR: 75.69 mL/min (ref >60.00)
Glucose, Bld: 94 mg/dL (ref 70–99)
Potassium: 4.3 mEq/L (ref 3.5–5.1)
Sodium: 139 mEq/L (ref 135–145)

## 2019-06-14 LAB — PSA: PSA: 0.39 ng/mL (ref 0.10–4.00)

## 2019-06-14 LAB — TSH: TSH: 1.37 u[IU]/mL (ref 0.35–4.50)

## 2019-06-14 LAB — LDL CHOLESTEROL, DIRECT: Direct LDL: 131 mg/dL

## 2019-06-14 NOTE — Progress Notes (Signed)
   Subjective:    Patient ID: Blake Brown, male    DOB: 1966-07-21, 53 y.o.   MRN: [id number]  HPI CPE- UTD on colonoscopy, Tdap.  Due for flu today.  Pt is down 5 lbs since last visit.   Review of Systems Patient reports no vision/hearing changes, anorexia, fever ,adenopathy, persistant/recurrent hoarseness, swallowing issues, chest pain, palpitations, edema, persistant/recurrent cough, hemoptysis, dyspnea (rest,exertional, paroxysmal nocturnal), gastrointestinal  bleeding (melena, rectal bleeding), abdominal pain, excessive heart burn, GU symptoms (dysuria, hematuria, voiding/incontinence issues) syncope, focal weakness, memory loss, numbness & tingling, skin/hair/nail changes, depression, anxiety, abnormal bruising/bleeding, musculoskeletal symptoms/signs.     Objective:   Physical Exam BP 121/81   Pulse 76   Temp 98.1 F (36.7 C) (Tympanic)   Resp 16   Ht 6\' 3"  (1.905 m)   Wt 221 lb 4 oz (100.4 kg)   SpO2 97%   BMI 27.65 kg/m   General Appearance:    Alert, cooperative, no distress, appears stated age  Head:    Normocephalic, without obvious abnormality, atraumatic  Eyes:    PERRL, conjunctiva/corneas clear, EOM's intact, fundi    benign, both eyes       Ears:    Normal TM's and external ear canals, both ears  Nose:   Deferred due to COVID  Throat:   Neck:   Supple, symmetrical, trachea midline, no adenopathy;       thyroid:  No enlargement/tenderness/nodules  Back:     Symmetric, no curvature, ROM normal, no CVA tenderness  Lungs:     Clear to auscultation bilaterally, respirations unlabored  Chest wall:    No tenderness or deformity  Heart:    Irregular S1 and S2 normal, no murmur, rub   or gallop  Abdomen:     Soft, non-tender, bowel sounds active all four quadrants,    no masses, no organomegaly  Genitalia:    Normal male without lesion, discharge or tenderness  Rectal:    Normal tone, normal prostate, no masses or tenderness  Extremities:   Extremities normal,  atraumatic, no cyanosis or edema  Pulses:   2+ and symmetric all extremities  Skin:   Skin color, texture, turgor normal, no rashes or lesions  Lymph nodes:   Cervical, supraclavicular, and axillary nodes normal  Neurologic:   CNII-XII intact. Normal strength, sensation and reflexes      throughout          Assessment & Plan:  Irregular heart beat- pt has occasional couplet rhythm on exam.  EKG shows frequent PACs.  Pt is asymptomatic.  No further work up at this time.  Will follow and refer to cards if sxs develop.  Pt expressed understanding and is in agreement w/ plan.

## 2019-06-14 NOTE — Assessment & Plan Note (Signed)
Ongoing issue for pt, down 5 lbs since last visit.  Applauded his efforts.  Check labs to risk stratify.  Will follow.

## 2019-06-14 NOTE — Assessment & Plan Note (Signed)
Pt's PE WNL w/ exception of couplet rhythm- which EKG shows as PACs.  UTD on colonoscopy, Tdap.  Flu shot given today.  Check labs.  Anticipatory guidance provided.

## 2019-06-14 NOTE — Patient Instructions (Addendum)
Follow up in 1 year or as needed We'll notify you of your lab results and make any changes if needed Continue to work on healthy diet and regular exercise- you look great!! As suspected, you are throwing a few extra beats- which is NORMAL.  If you were to develop palpitations, chest pain, or other concerns, we would want to further investigate, but not at this time Call with any questions or concerns Stay Safe!  Stay Sane!!!

## 2019-06-15 ENCOUNTER — Encounter: Payer: Self-pay | Admitting: General Practice

## 2019-12-08 DIAGNOSIS — D225 Melanocytic nevi of trunk: Secondary | ICD-10-CM | POA: Diagnosis not present

## 2019-12-08 DIAGNOSIS — D1801 Hemangioma of skin and subcutaneous tissue: Secondary | ICD-10-CM | POA: Diagnosis not present

## 2019-12-08 DIAGNOSIS — D2262 Melanocytic nevi of left upper limb, including shoulder: Secondary | ICD-10-CM | POA: Diagnosis not present

## 2019-12-08 DIAGNOSIS — Z85828 Personal history of other malignant neoplasm of skin: Secondary | ICD-10-CM | POA: Diagnosis not present

## 2019-12-08 DIAGNOSIS — D485 Neoplasm of uncertain behavior of skin: Secondary | ICD-10-CM | POA: Diagnosis not present

## 2019-12-08 DIAGNOSIS — D2272 Melanocytic nevi of left lower limb, including hip: Secondary | ICD-10-CM | POA: Diagnosis not present

## 2019-12-08 DIAGNOSIS — D2271 Melanocytic nevi of right lower limb, including hip: Secondary | ICD-10-CM | POA: Diagnosis not present

## 2019-12-08 DIAGNOSIS — L819 Disorder of pigmentation, unspecified: Secondary | ICD-10-CM | POA: Diagnosis not present

## 2019-12-08 DIAGNOSIS — L820 Inflamed seborrheic keratosis: Secondary | ICD-10-CM | POA: Diagnosis not present

## 2019-12-08 DIAGNOSIS — D2261 Melanocytic nevi of right upper limb, including shoulder: Secondary | ICD-10-CM | POA: Diagnosis not present

## 2020-06-18 ENCOUNTER — Other Ambulatory Visit: Payer: Self-pay

## 2020-06-18 ENCOUNTER — Encounter: Payer: Self-pay | Admitting: Family Medicine

## 2020-06-18 ENCOUNTER — Ambulatory Visit (INDEPENDENT_AMBULATORY_CARE_PROVIDER_SITE_OTHER): Payer: 59 | Admitting: Family Medicine

## 2020-06-18 ENCOUNTER — Encounter: Payer: Self-pay | Admitting: General Practice

## 2020-06-18 VITALS — BP 123/82 | HR 78 | Temp 97.3°F | Resp 16 | Ht 75.0 in | Wt 222.2 lb

## 2020-06-18 DIAGNOSIS — E663 Overweight: Secondary | ICD-10-CM | POA: Diagnosis not present

## 2020-06-18 DIAGNOSIS — Z125 Encounter for screening for malignant neoplasm of prostate: Secondary | ICD-10-CM | POA: Diagnosis not present

## 2020-06-18 DIAGNOSIS — Z23 Encounter for immunization: Secondary | ICD-10-CM

## 2020-06-18 DIAGNOSIS — Z0000 Encounter for general adult medical examination without abnormal findings: Secondary | ICD-10-CM

## 2020-06-18 LAB — BASIC METABOLIC PANEL
BUN: 14 mg/dL (ref 6–23)
CO2: 27 mEq/L (ref 19–32)
Calcium: 9.4 mg/dL (ref 8.4–10.5)
Chloride: 103 mEq/L (ref 96–112)
Creatinine, Ser: 1.14 mg/dL (ref 0.40–1.50)
GFR: 66.33 mL/min (ref >60.00)
Glucose, Bld: 99 mg/dL (ref 70–99)
Potassium: 4.4 mEq/L (ref 3.5–5.1)
Sodium: 140 mEq/L (ref 135–145)

## 2020-06-18 LAB — LIPID PANEL
Cholesterol: 224 mg/dL — ABNORMAL HIGH (ref 0–200)
HDL: 66.3 mg/dL (ref >39.00)
LDL Cholesterol: 133 mg/dL — ABNORMAL HIGH (ref 0–99)
NonHDL: 157.27
Total CHOL/HDL Ratio: 3
Triglycerides: 122 mg/dL (ref 0.0–149.0)
VLDL: 24.4 mg/dL (ref 0.0–40.0)

## 2020-06-18 LAB — CBC WITH DIFFERENTIAL/PLATELET
Basophils Absolute: 0.1 10*3/uL (ref 0.0–0.1)
Basophils Relative: 0.9 % (ref 0.0–3.0)
Eosinophils Absolute: 0.3 10*3/uL (ref 0.0–0.7)
Eosinophils Relative: 4.4 % (ref 0.0–5.0)
HCT: 46.3 % (ref 39.0–52.0)
Hemoglobin: 15.5 g/dL (ref 13.0–17.0)
Lymphocytes Relative: 32.4 % (ref 12.0–46.0)
Lymphs Abs: 1.9 10*3/uL (ref 0.7–4.0)
MCHC: 33.4 g/dL (ref 30.0–36.0)
MCV: 92.6 fl (ref 78.0–100.0)
Monocytes Absolute: 0.5 10*3/uL (ref 0.1–1.0)
Monocytes Relative: 8.1 % (ref 3.0–12.0)
Neutro Abs: 3.2 10*3/uL (ref 1.4–7.7)
Neutrophils Relative %: 54.2 % (ref 43.0–77.0)
Platelets: 225 10*3/uL (ref 150.0–400.0)
RBC: 5.01 Mil/uL (ref 4.22–5.81)
RDW: 13.4 % (ref 11.5–15.5)
WBC: 5.9 10*3/uL (ref 4.0–10.5)

## 2020-06-18 LAB — TSH: TSH: 1.14 u[IU]/mL (ref 0.35–4.50)

## 2020-06-18 LAB — HEPATIC FUNCTION PANEL
ALT: 18 U/L (ref 0–53)
AST: 19 U/L (ref 0–37)
Albumin: 4.8 g/dL (ref 3.5–5.2)
Alkaline Phosphatase: 48 U/L (ref 39–117)
Bilirubin, Direct: 0.1 mg/dL (ref 0.0–0.3)
Total Bilirubin: 0.8 mg/dL (ref 0.2–1.2)
Total Protein: 6.9 g/dL (ref 6.0–8.3)

## 2020-06-18 LAB — PSA: PSA: 0.38 ng/mL (ref 0.10–4.00)

## 2020-06-18 NOTE — Progress Notes (Signed)
   Subjective:    Patient ID: Blake Brown, male    DOB: Sep 12, 1966, 54 y.o.   MRN: [id number]  HPI CPE- UTD on Tdap, COVID vaccines.  UTD on colonoscopy.  No concerns today.  Reviewed past medical, surgical, family and social histories.   Patient Care Team    Relationship Specialty Notifications Start End  Midge Minium, MD PCP - General Family Medicine  03/12/17   Juanita Craver, MD Consulting Physician Gastroenterology  03/12/17       Review of Systems Patient reports no vision/hearing changes, anorexia, fever ,adenopathy, persistant/recurrent hoarseness, swallowing issues, chest pain, palpitations, edema, persistant/recurrent cough, hemoptysis, dyspnea (rest,exertional, paroxysmal nocturnal), gastrointestinal  bleeding (melena, rectal bleeding), abdominal pain, excessive heart burn, GU symptoms (dysuria, hematuria, voiding/incontinence issues) syncope, focal weakness, memory loss, numbness & tingling, skin/hair/nail changes, depression, anxiety, abnormal bruising/bleeding, musculoskeletal symptoms/signs.   This visit occurred during the SARS-CoV-2 public health emergency.  Safety protocols were in place, including screening questions prior to the visit, additional usage of staff PPE, and extensive cleaning of exam room while observing appropriate contact time as indicated for disinfecting solutions.       Objective:   Physical Exam General Appearance:    Alert, cooperative, no distress, appears stated age  Head:    Normocephalic, without obvious abnormality, atraumatic  Eyes:    PERRL, conjunctiva/corneas clear, EOM's intact, fundi    benign, both eyes       Ears:    Normal TM's and external ear canals, both ears  Nose:   Deferred due to COVID  Throat:   Neck:   Supple, symmetrical, trachea midline, no adenopathy;       thyroid:  No enlargement/tenderness/nodules  Back:     Symmetric, no curvature, ROM normal, no CVA tenderness  Lungs:     Clear to auscultation bilaterally,  respirations unlabored  Chest wall:    No tenderness or deformity  Heart:    Regular rate and rhythm, S1 and S2 normal, no murmur, rub   or gallop  Abdomen:     Soft, non-tender, bowel sounds active all four quadrants,    no masses, no organomegaly  Genitalia:    Deferred  Rectal:    Extremities:   Extremities normal, atraumatic, no cyanosis or edema  Pulses:   2+ and symmetric all extremities  Skin:   Skin color, texture, turgor normal, no rashes or lesions  Lymph nodes:   Cervical, supraclavicular, and axillary nodes normal  Neurologic:   CNII-XII intact. Normal strength, sensation and reflexes      throughout          Assessment & Plan:

## 2020-06-18 NOTE — Patient Instructions (Signed)
Follow up in 1 year or as needed We'll notify you of your lab results and make any changes if needed Keep up the good work on healthy diet and regular exercise- you look great!!! Call with any questions or concerns Stay Safe!  Stay Healthy! 

## 2020-06-18 NOTE — Assessment & Plan Note (Signed)
Pt's PE WNL.  UTD on colonoscopy, COVID, Tdap.  Flu shot given today.  Check labs.  Anticipatory guidance provided.

## 2020-06-18 NOTE — Assessment & Plan Note (Signed)
Pt's weight has remainded stable over the last year.  Applauded her efforts at healthy diet and regular exercise.  BMI is 27.78  Check labs to risk stratify.  Will follow.

## 2020-06-26 DIAGNOSIS — K5730 Diverticulosis of large intestine without perforation or abscess without bleeding: Secondary | ICD-10-CM | POA: Diagnosis not present

## 2020-06-26 DIAGNOSIS — Z86010 Personal history of colonic polyps: Secondary | ICD-10-CM | POA: Diagnosis not present

## 2020-06-26 DIAGNOSIS — Z1211 Encounter for screening for malignant neoplasm of colon: Secondary | ICD-10-CM | POA: Diagnosis not present

## 2020-08-17 ENCOUNTER — Other Ambulatory Visit (HOSPITAL_COMMUNITY): Payer: Self-pay | Admitting: Gastroenterology

## 2020-08-17 MED FILL — CLENPIQ 10-3.5-12 MG-GM -GM: 10-3.5-12 M | 1 days supply | Qty: 320 | Fill #0

## 2020-11-19 ENCOUNTER — Encounter: Payer: Self-pay | Admitting: Emergency Medicine

## 2020-11-19 DIAGNOSIS — K5730 Diverticulosis of large intestine without perforation or abscess without bleeding: Secondary | ICD-10-CM | POA: Diagnosis not present

## 2020-11-19 DIAGNOSIS — Z1211 Encounter for screening for malignant neoplasm of colon: Secondary | ICD-10-CM | POA: Diagnosis not present

## 2020-11-19 LAB — HM COLONOSCOPY

## 2021-02-25 DIAGNOSIS — D225 Melanocytic nevi of trunk: Secondary | ICD-10-CM | POA: Diagnosis not present

## 2021-02-25 DIAGNOSIS — D2361 Other benign neoplasm of skin of right upper limb, including shoulder: Secondary | ICD-10-CM | POA: Diagnosis not present

## 2021-02-25 DIAGNOSIS — D2239 Melanocytic nevi of other parts of face: Secondary | ICD-10-CM | POA: Diagnosis not present

## 2021-02-25 DIAGNOSIS — D2271 Melanocytic nevi of right lower limb, including hip: Secondary | ICD-10-CM | POA: Diagnosis not present

## 2021-02-25 DIAGNOSIS — L821 Other seborrheic keratosis: Secondary | ICD-10-CM | POA: Diagnosis not present

## 2021-02-25 DIAGNOSIS — D1801 Hemangioma of skin and subcutaneous tissue: Secondary | ICD-10-CM | POA: Diagnosis not present

## 2021-02-25 DIAGNOSIS — Z85828 Personal history of other malignant neoplasm of skin: Secondary | ICD-10-CM | POA: Diagnosis not present

## 2021-06-20 ENCOUNTER — Encounter: Payer: Self-pay | Admitting: Family Medicine

## 2021-06-20 ENCOUNTER — Other Ambulatory Visit: Payer: Self-pay

## 2021-06-20 ENCOUNTER — Ambulatory Visit (INDEPENDENT_AMBULATORY_CARE_PROVIDER_SITE_OTHER): Payer: 59 | Admitting: Family Medicine

## 2021-06-20 VITALS — BP 130/80 | HR 50 | Temp 97.9°F | Resp 18 | Ht 75.0 in | Wt 223.8 lb

## 2021-06-20 DIAGNOSIS — E663 Overweight: Secondary | ICD-10-CM | POA: Diagnosis not present

## 2021-06-20 DIAGNOSIS — Z125 Encounter for screening for malignant neoplasm of prostate: Secondary | ICD-10-CM | POA: Diagnosis not present

## 2021-06-20 DIAGNOSIS — Z23 Encounter for immunization: Secondary | ICD-10-CM | POA: Diagnosis not present

## 2021-06-20 DIAGNOSIS — R269 Unspecified abnormalities of gait and mobility: Secondary | ICD-10-CM

## 2021-06-20 DIAGNOSIS — Z0000 Encounter for general adult medical examination without abnormal findings: Secondary | ICD-10-CM

## 2021-06-20 LAB — CBC WITH DIFFERENTIAL/PLATELET
Basophils Absolute: 0.1 10*3/uL (ref 0.0–0.1)
Basophils Relative: 1.2 % (ref 0.0–3.0)
Eosinophils Absolute: 0.3 10*3/uL (ref 0.0–0.7)
Eosinophils Relative: 5.2 % — ABNORMAL HIGH (ref 0.0–5.0)
HCT: 44.9 % (ref 39.0–52.0)
Hemoglobin: 15 g/dL (ref 13.0–17.0)
Lymphocytes Relative: 29.1 % (ref 12.0–46.0)
Lymphs Abs: 1.7 10*3/uL (ref 0.7–4.0)
MCHC: 33.5 g/dL (ref 30.0–36.0)
MCV: 92.8 fl (ref 78.0–100.0)
Monocytes Absolute: 0.5 10*3/uL (ref 0.1–1.0)
Monocytes Relative: 8.2 % (ref 3.0–12.0)
Neutro Abs: 3.2 10*3/uL (ref 1.4–7.7)
Neutrophils Relative %: 56.3 % (ref 43.0–77.0)
Platelets: 254 10*3/uL (ref 150.0–400.0)
RBC: 4.83 Mil/uL (ref 4.22–5.81)
RDW: 13.9 % (ref 11.5–15.5)
WBC: 5.7 10*3/uL (ref 4.0–10.5)

## 2021-06-20 LAB — TSH: TSH: 1.01 u[IU]/mL (ref 0.35–5.50)

## 2021-06-20 LAB — HEPATIC FUNCTION PANEL
ALT: 16 U/L (ref 0–53)
AST: 18 U/L (ref 0–37)
Albumin: 4.8 g/dL (ref 3.5–5.2)
Alkaline Phosphatase: 52 U/L (ref 39–117)
Bilirubin, Direct: 0.1 mg/dL (ref 0.0–0.3)
Total Bilirubin: 0.6 mg/dL (ref 0.2–1.2)
Total Protein: 7 g/dL (ref 6.0–8.3)

## 2021-06-20 LAB — PSA: PSA: 0.43 ng/mL (ref 0.10–4.00)

## 2021-06-20 LAB — BASIC METABOLIC PANEL
BUN: 15 mg/dL (ref 6–23)
CO2: 29 mEq/L (ref 19–32)
Calcium: 9.9 mg/dL (ref 8.4–10.5)
Chloride: 106 mEq/L (ref 96–112)
Creatinine, Ser: 1.06 mg/dL (ref 0.40–1.50)
GFR: 77.91 mL/min (ref >60.00)
Glucose, Bld: 101 mg/dL — ABNORMAL HIGH (ref 70–99)
Potassium: 4.6 mEq/L (ref 3.5–5.1)
Sodium: 141 mEq/L (ref 135–145)

## 2021-06-20 LAB — LIPID PANEL
Cholesterol: 210 mg/dL — ABNORMAL HIGH (ref 0–200)
HDL: 72.5 mg/dL (ref >39.00)
LDL Cholesterol: 98 mg/dL (ref 0–99)
NonHDL: 137.94
Total CHOL/HDL Ratio: 3
Triglycerides: 200 mg/dL — ABNORMAL HIGH (ref 0.0–149.0)
VLDL: 40 mg/dL (ref 0.0–40.0)

## 2021-06-20 NOTE — Progress Notes (Signed)
   Subjective:    Patient ID: Blake Brown, male    DOB: Aug 03, 1966, 55 y.o.   MRN: [id number]  HPI CPE- UTD on Tdap, colonoscopy.  Due for flu.  Will get COVID booster later today.  No concerns today  Patient Care Team    Relationship Specialty Notifications Start End  Midge Minium, MD PCP - General Family Medicine  03/12/17   Juanita Craver, MD Consulting Physician Gastroenterology  03/12/17     Health Maintenance  Topic Date Due   COVID-19 Vaccine (1) Never done   INFLUENZA VACCINE  04/15/2021   Hepatitis C Screening  06/20/2022 (Originally 12/20/1981)   HIV Screening  06/20/2022 (Originally 12/21/1978)   COLONOSCOPY (Pts 45-45yrs Insurance coverage will need to be confirmed)  11/19/2025   TETANUS/TDAP  01/24/2027   Zoster Vaccines- Shingrix  Completed   HPV VACCINES  Aged Out      Review of Systems Patient reports no vision/hearing changes, anorexia, fever ,adenopathy, persistant/recurrent hoarseness, swallowing issues, chest pain, palpitations, edema, persistant/recurrent cough, hemoptysis, dyspnea (rest,exertional, paroxysmal nocturnal), gastrointestinal  bleeding (melena, rectal bleeding), abdominal pain, excessive heart burn, GU symptoms (dysuria, hematuria, voiding/incontinence issues) syncope, focal weakness, memory loss, numbness & tingling, skin/hair/nail changes, depression, anxiety, abnormal bruising/bleeding, musculoskeletal symptoms/signs.   This visit occurred during the SARS-CoV-2 public health emergency.  Safety protocols were in place, including screening questions prior to the visit, additional usage of staff PPE, and extensive cleaning of exam room while observing appropriate contact time as indicated for disinfecting solutions.      Objective:   Physical Exam General Appearance:    Alert, cooperative, no distress, appears stated age  Head:    Normocephalic, without obvious abnormality, atraumatic  Eyes:    PERRL, conjunctiva/corneas clear, EOM's intact, fundi     benign, both eyes       Ears:    Normal TM's and external ear canals, both ears  Nose:   Deferred due to COVID  Throat:   Neck:   Supple, symmetrical, trachea midline, no adenopathy;       thyroid:  No enlargement/tenderness/nodules  Back:     Symmetric, no curvature, ROM normal, no CVA tenderness  Lungs:     Clear to auscultation bilaterally, respirations unlabored  Chest wall:    No tenderness or deformity  Heart:    Regular rate and rhythm, S1 and S2 normal, no murmur, rub   or gallop  Abdomen:     Soft, non-tender, bowel sounds active all four quadrants,    no masses, no organomegaly  Genitalia:    Deferred  Rectal:    Extremities:   Extremities normal, atraumatic, no cyanosis or edema  Pulses:   2+ and symmetric all extremities  Skin:   Skin color, texture, turgor normal, no rashes or lesions  Lymph nodes:   Cervical, supraclavicular, and axillary nodes normal  Neurologic:   CNII-XII intact. Normal strength, sensation and reflexes      throughout          Assessment & Plan:

## 2021-06-20 NOTE — Assessment & Plan Note (Signed)
Pt's PE WNL w/ exception of mildly elevated BMI.  UTD on colonoscopy, Tdap.  Flu shot given.  Check labs.  Anticipatory guidance provided.

## 2021-06-20 NOTE — Patient Instructions (Addendum)
Follow up in 1 year or as needed We'll notify you of your lab results and make any changes if needed We'll call you with your Sports Med referral Continue to work on healthy diet and regular exercise- you can do it! Call with any questions or concerns Stay Safe!  Stay Healthy! Happy Fall!!!

## 2021-06-20 NOTE — Addendum Note (Signed)
Addended by: Juliann Pulse on: 06/20/2021 08:14 AM   Modules accepted: Orders

## 2021-06-26 ENCOUNTER — Ambulatory Visit (INDEPENDENT_AMBULATORY_CARE_PROVIDER_SITE_OTHER): Payer: 59 | Admitting: Family Medicine

## 2021-06-26 VITALS — BP 150/98 | Ht 75.0 in | Wt 217.0 lb

## 2021-06-26 DIAGNOSIS — R269 Unspecified abnormalities of gait and mobility: Secondary | ICD-10-CM | POA: Diagnosis not present

## 2021-06-26 DIAGNOSIS — M19072 Primary osteoarthritis, left ankle and foot: Secondary | ICD-10-CM | POA: Diagnosis not present

## 2021-06-26 DIAGNOSIS — M19071 Primary osteoarthritis, right ankle and foot: Secondary | ICD-10-CM

## 2021-06-26 NOTE — Progress Notes (Signed)
PCP: Midge Minium, MD  Subjective:   HPI: Patient is a 55 y.o. male here for foot, back/hip pain.  Patient reports he's had consistent pain base of both great toes for several years. Associated swelling dorsal aspect of these joints. Worse with prolonged standing and walking. Also reports intermittent pain lateral hips with burning and intermittent pain in low back. Neither hips nor low back bothering him now. Interested in gait evaluation.  Past Medical History:  Diagnosis Date   Allergy    History of chicken pox     Current Outpatient Medications on File Prior to Visit  Medication Sig Dispense Refill   Sod Picosulfate-Mag Ox-Cit Acd 10-3.5-12 MG-GM -GM/160ML SOLN USE AS DIRECTED (Patient not taking: Reported on 06/20/2021) 320 mL 0   No current facility-administered medications on file prior to visit.    Past Surgical History:  Procedure Laterality Date   TONSILLECTOMY AND ADENOIDECTOMY      No Known Allergies  BP (!) 150/98   Ht 6\' 3"  (1.905 m)   Wt 217 lb (98.4 kg)   BMI 27.12 kg/m   [facility] 06/26/2021  Frequency of aerobic exercise (# of days/week) 4  Average time in minutes 25  Frequency of strengthening activities (# of days/week) 7    No flowsheet data found.      Objective:  Physical Exam:  Gen: NAD, comfortable in exam room  Bilateral feet/ankles: Morton's feet.  Spurring dorsal 1st MTP palpated, mild tenderness here on right.  Calcaneal valgus.  Mod pronation.  No other gross deformity, swelling, ecchymoses FROM ankles with normal strength No other TTP besides right 1st MTP. negative ant drawer and negative talar tilt.   Negative syndesmotic compression. Thompsons test negative. NV intact distally.  Back: No gross deformity, scoliosis.  Mild hamstring inflexibility No TTP.  No midline or bony TTP. FROM. Strength LEs 5/5 all muscle groups.   Negative SLRs. Sensation intact to light touch  bilaterally.  Hips: No deformity. FROM with 5/5 strength except 4/5 bilateral hip abduction. No tenderness to palpation. NVI distally. Negative logroll Negative faber, fadir, and piriformis stretches.  Good SI joint mobility.  Leg lengths equal. Gait: Increased anterior pelvic rotation, trunk rotation.  Outturns both feet.   Assessment & Plan:  1. Gait abnormality - abnormal trunk and pelvic rotation with underlying hip abduction and core weakness.  Home exercises reviewed.  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  Would not recommend custom orthotics for his pronation as pain not related to this.  2. 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  1st ray posts bilaterally.

## 2021-06-26 NOTE — Patient Instructions (Signed)
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 We added 1st ray posts to your shoe inserts which usually helps quite a bit with this. Avoid flat shoes, barefoot walking. We can add this posting to other shoes (or otc inserts) if you would like, just let us know. While you do pronate quite a bit the pain you have is not related to this so I wouldn't recommend something like custom orthotics at this point.  You have increased pelvic/trunk rotation, hip abduction weakness that is affecting your gait. Do home exercises as directed most days of the week. I don't think you need formal physical therapy but this is an option as well if you struggle going forward. Follow up with me in 6 weeks for reevaluation.

## 2021-07-02 DIAGNOSIS — L918 Other hypertrophic disorders of the skin: Secondary | ICD-10-CM | POA: Diagnosis not present

## 2021-07-02 DIAGNOSIS — D1801 Hemangioma of skin and subcutaneous tissue: Secondary | ICD-10-CM | POA: Diagnosis not present

## 2021-07-02 DIAGNOSIS — D2261 Melanocytic nevi of right upper limb, including shoulder: Secondary | ICD-10-CM | POA: Diagnosis not present

## 2021-07-02 DIAGNOSIS — Z85828 Personal history of other malignant neoplasm of skin: Secondary | ICD-10-CM | POA: Diagnosis not present

## 2021-07-02 DIAGNOSIS — D2271 Melanocytic nevi of right lower limb, including hip: Secondary | ICD-10-CM | POA: Diagnosis not present

## 2021-07-02 DIAGNOSIS — D225 Melanocytic nevi of trunk: Secondary | ICD-10-CM | POA: Diagnosis not present

## 2021-07-02 DIAGNOSIS — L821 Other seborrheic keratosis: Secondary | ICD-10-CM | POA: Diagnosis not present

## 2021-08-07 ENCOUNTER — Ambulatory Visit (INDEPENDENT_AMBULATORY_CARE_PROVIDER_SITE_OTHER): Payer: 59 | Admitting: Family Medicine

## 2021-08-07 ENCOUNTER — Encounter: Payer: Self-pay | Admitting: Family Medicine

## 2021-08-07 VITALS — BP 140/88 | Ht 75.0 in | Wt 215.0 lb

## 2021-08-07 DIAGNOSIS — M19072 Primary osteoarthritis, left ankle and foot: Secondary | ICD-10-CM

## 2021-08-07 DIAGNOSIS — R269 Unspecified abnormalities of gait and mobility: Secondary | ICD-10-CM | POA: Diagnosis not present

## 2021-08-07 DIAGNOSIS — M19071 Primary osteoarthritis, right ankle and foot: Secondary | ICD-10-CM | POA: Diagnosis not present

## 2021-08-07 NOTE — Progress Notes (Signed)
PCP: Midge Minium, MD  Subjective:   HPI: Patient is a 55 y.o. male here for foot, back/hip pain.  10/12: Patient reports he's had consistent pain base of both great toes for several years. Associated swelling dorsal aspect of these joints. Worse with prolonged standing and walking. Also reports intermittent pain lateral hips with burning and intermittent pain in low back. Neither hips nor low back bothering him now. Interested in gait evaluation.  11/23: Patient reports improvement since last visit. His hips and low back feel better. Some soreness in morning for about 5-10 minutes in low back then gets going and is fine. Feet feel about the same. Doing home exercises/stretches and wearing insoles with 1st ray posts.  Past Medical History:  Diagnosis Date   Allergy    History of chicken pox     Current Outpatient Medications on File Prior to Visit  Medication Sig Dispense Refill   Sod Picosulfate-Mag Ox-Cit Acd 10-3.5-12 MG-GM -GM/160ML SOLN USE AS DIRECTED (Patient not taking: Reported on 06/20/2021) 320 mL 0   No current facility-administered medications on file prior to visit.    Past Surgical History:  Procedure Laterality Date   TONSILLECTOMY AND ADENOIDECTOMY      No Known Allergies  BP 140/88   Ht 6\' 3"  (1.905 m)   Wt 215 lb (97.5 kg)   BMI 26.87 kg/m   [facility] 06/26/2021 08/07/2021  Frequency of aerobic exercise (# of days/week) 4 4  Average time in minutes 25 25  Frequency of strengthening activities (# of days/week) 7 7    No flowsheet data found.      Objective:  Physical Exam:  Gen: NAD, comfortable in exam room  Bilateral feet/ankles: Morton's feet.  Spurring dorsal 1st MTPs without tenderness.  Calcaneal valgus with mod pronation long arches.  No other deformity. FROM with normal strength No TTP Thompsons test negative. NV intact distally.  Hips: FROM with 5/5 strength including hip  abduction.  Gait: Still noted increased trunk rotation.  Decreased outturning bilateral feet.   Assessment & Plan:  1. Gait abnormality - improved with better hip abduction strength.  Pain notably better in hips and low back.  Continue home exercises.  2. 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  Decreased tenderness here.

## 2021-09-06 ENCOUNTER — Emergency Department (HOSPITAL_COMMUNITY)
Admission: EM | Admit: 2021-09-06 | Discharge: 2021-09-07 | Disposition: A | Discharge status: Home / Self Care | Payer: 59 | Attending: Emergency Medicine | Admitting: Emergency Medicine

## 2021-09-06 ENCOUNTER — Emergency Department (HOSPITAL_COMMUNITY): Payer: 59

## 2021-09-06 DIAGNOSIS — I959 Hypotension, unspecified: Secondary | ICD-10-CM | POA: Insufficient documentation

## 2021-09-06 DIAGNOSIS — Y902 Blood alcohol level of 40-59 mg/100 ml: Secondary | ICD-10-CM | POA: Diagnosis not present

## 2021-09-06 DIAGNOSIS — R4781 Slurred speech: Secondary | ICD-10-CM

## 2021-09-06 DIAGNOSIS — R0902 Hypoxemia: Secondary | ICD-10-CM | POA: Diagnosis not present

## 2021-09-06 DIAGNOSIS — F1290 Cannabis use, unspecified, uncomplicated: Secondary | ICD-10-CM | POA: Insufficient documentation

## 2021-09-06 DIAGNOSIS — I639 Cerebral infarction, unspecified: Secondary | ICD-10-CM | POA: Diagnosis not present

## 2021-09-06 DIAGNOSIS — I499 Cardiac arrhythmia, unspecified: Secondary | ICD-10-CM | POA: Diagnosis not present

## 2021-09-06 DIAGNOSIS — R42 Dizziness and giddiness: Secondary | ICD-10-CM | POA: Insufficient documentation

## 2021-09-06 DIAGNOSIS — R55 Syncope and collapse: Secondary | ICD-10-CM | POA: Insufficient documentation

## 2021-09-06 DIAGNOSIS — Z87891 Personal history of nicotine dependence: Secondary | ICD-10-CM | POA: Diagnosis not present

## 2021-09-06 DIAGNOSIS — E860 Dehydration: Secondary | ICD-10-CM

## 2021-09-06 LAB — CBC WITH DIFFERENTIAL/PLATELET
Abs Immature Granulocytes: 0.07 10*3/uL (ref 0.00–0.07)
Basophils Absolute: 0.1 10*3/uL (ref 0.0–0.1)
Basophils Relative: 1 %
Eosinophils Absolute: 0.2 10*3/uL (ref 0.0–0.5)
Eosinophils Relative: 3 %
HCT: 40.8 % (ref 39.0–52.0)
Hemoglobin: 13.8 g/dL (ref 13.0–17.0)
Immature Granulocytes: 1 %
Lymphocytes Relative: 27 %
Lymphs Abs: 1.9 10*3/uL (ref 0.7–4.0)
MCH: 31.5 pg (ref 26.0–34.0)
MCHC: 33.8 g/dL (ref 30.0–36.0)
MCV: 93.2 fL (ref 80.0–100.0)
Monocytes Absolute: 0.6 10*3/uL (ref 0.1–1.0)
Monocytes Relative: 9 %
Neutro Abs: 4.1 10*3/uL (ref 1.7–7.7)
Neutrophils Relative %: 59 %
Platelets: 252 10*3/uL (ref 150–400)
RBC: 4.38 MIL/uL (ref 4.22–5.81)
RDW: 12.4 % (ref 11.5–15.5)
WBC: 6.9 10*3/uL (ref 4.0–10.5)
nRBC: 0 % (ref 0.0–0.2)

## 2021-09-06 LAB — COMPREHENSIVE METABOLIC PANEL
ALT: 22 U/L (ref 0–44)
AST: 22 U/L (ref 15–41)
Albumin: 3.8 g/dL (ref 3.5–5.0)
Alkaline Phosphatase: 50 U/L (ref 38–126)
Anion gap: 10 (ref 5–15)
BUN: 15 mg/dL (ref 6–20)
CO2: 26 mmol/L (ref 22–32)
Calcium: 8.9 mg/dL (ref 8.9–10.3)
Chloride: 103 mmol/L (ref 98–111)
Creatinine, Ser: 1.18 mg/dL (ref 0.61–1.24)
GFR, Estimated: >60 mL/min (ref >60)
Glucose, Bld: 130 mg/dL — ABNORMAL HIGH (ref 70–99)
Potassium: 3.8 mmol/L (ref 3.5–5.1)
Sodium: 139 mmol/L (ref 135–145)
Total Bilirubin: 0.5 mg/dL (ref 0.3–1.2)
Total Protein: 6.2 g/dL — ABNORMAL LOW (ref 6.5–8.1)

## 2021-09-06 LAB — ETHANOL: Alcohol, Ethyl (B): 49 mg/dL — ABNORMAL HIGH (ref <10)

## 2021-09-06 LAB — APTT: aPTT: 27 seconds (ref 24–36)

## 2021-09-06 LAB — PROTIME-INR
INR: 1 (ref 0.8–1.2)
Prothrombin Time: 12.7 seconds (ref 11.4–15.2)

## 2021-09-06 LAB — TROPONIN I (HIGH SENSITIVITY): Troponin I (High Sensitivity): 5 ng/L (ref <18)

## 2021-09-06 LAB — MAGNESIUM: Magnesium: 2.1 mg/dL (ref 1.7–2.4)

## 2021-09-06 MED ORDER — SODIUM CHLORIDE 0.9 % IV BOLUS: 1000.0000 mL | Freq: Once | INTRAVENOUS | Status: DC

## 2021-09-06 MED ORDER — SODIUM CHLORIDE 0.9 % IV BOLUS
1000.0000 mL | Freq: Once | INTRAVENOUS | Status: AC
  Administered 2021-09-06: 1000 mL via INTRAVENOUS

## 2021-09-06 NOTE — ED Notes (Signed)
22

## 2021-09-06 NOTE — Consult Note (Signed)
NEUROLOGY CONSULTATION NOTE   Date of service: September 06, 2021 Patient Name: Blake Brown MRN:  [id number] DOB:  [birth year] Reason for consult: "Slurred speech" Requesting Provider: Luna Fuse, MD _ _ _   _ __   _ __ _ _  __ __   _ __   __ _  History of Present Illness  Blake Brown is a 55 y.o. male with PMH significant for allergies and chickenpox who is been weak with an upper respiratory illness for the last week with cough and fever.  He was eating a large meal tonight around 7:30 PM when he had lightheadedness and presyncopal episode and was noted to have slurring of his speech immediately afterwards.  His wife is a Electrical engineer and brought him to the emergency department.  In the ED, lie felt that his speech was persistently slurred and a code stroke was activated.  On my evaluation, patient noted to have mild slurring of his speech but no other focal neurological deficit.  MR S: 0 TNKase/thrombectomy: Not offered due to low suspicion for stroke. LK W: 1930 on 09/06/2021.  NIHSS components Score: Comment  1a Level of Conscious 0[x]  1[]  2[]  3[]      1b LOC Questions 0[x]  1[]  2[]       1c LOC Commands 0[x]  1[]  2[]       2 Best Gaze 0[x]  1[]  2[]       3 Visual 0[x]  1[]  2[]  3[]      4 Facial Palsy 0[x]  1[]  2[]  3[]      5a Motor Arm - left 0[x]  1[]  2[]  3[]  4[]  UN[]    5b Motor Arm - Right 0[x]  1[]  2[]  3[]  4[]  UN[]    6a Motor Leg - Left 0[x]  1[]  2[]  3[]  4[]  UN[]    6b Motor Leg - Right 0[x]  1[]  2[]  3[]  4[]  UN[]    7 Limb Ataxia 0[x]  1[]  2[]  3[]  UN[]     8 Sensory 0[x]  1[]  2[]  UN[]      9 Best Language 0[x]  1[]  2[]  3[]      10 Dysarthria 0[]  1[x]  2[]  UN[]      11 Extinct. and Inattention 0[x]  1[]  2[]       TOTAL: 1      ROS   Constitutional Denies weight loss, fever and chills.   HEENT Denies changes in vision and hearing.   Respiratory Denies SOB and cough.   CV Denies palpitations and CP   GI Denies abdominal pain, nausea, vomiting and diarrhea.   GU Denies dysuria  and urinary frequency.   MSK Denies myalgia and joint pain.   Skin Denies rash and pruritus.   Neurological Denies headache and syncope.   Psychiatric Denies recent changes in mood. Denies anxiety and depression.    Past History   Past Medical History:  Diagnosis Date   Allergy    History of chicken pox    Past Surgical History:  Procedure Laterality Date   TONSILLECTOMY AND ADENOIDECTOMY     Family History  Problem Relation Age of Onset   Dementia Mother    Cancer Father        mesothelioma   Diabetes Father    Social History   Socioeconomic History   Marital status: Married    Spouse name: Not on file   Number of children: Not on file   Years of education: Not on file   Highest education level: Not on file  Occupational History   Not on file  Tobacco Use   Smoking status: Former   Smokeless  tobacco: Never  Vaping Use   Vaping Use: Never used  Substance and Sexual Activity   Alcohol use: Yes   Drug use: No   Sexual activity: Not on file  Other Topics Concern   Not on file  Social History Narrative   Not on file   Social Determinants of Health   Financial Resource Strain: Not on file  Food Insecurity: Not on file  Transportation Needs: Not on file  Physical Activity: Not on file  Stress: Not on file  Social Connections: Not on file   No Known Allergies  Medications  (Not in a hospital admission)    Vitals   Vitals:   09/06/21 2140  BP: 96/65  Pulse: (!) 50  Resp: 17  Temp: (!) 97.5 F (36.4 C)  TempSrc: Oral  SpO2: 95%     There is no height or weight on file to calculate BMI.  Physical Exam   General: Laying comfortably in bed; in no acute distress.  HENT: Normal oropharynx and mucosa. Normal external appearance of ears and nose.  Neck: Supple, no pain or tenderness  CV: No JVD. No peripheral edema.  Pulmonary: Symmetric Chest rise. Normal respiratory effort.  Abdomen: Soft to touch, non-tender.  Ext: No cyanosis, edema, or  deformity  Skin: No rash. Normal palpation of skin.  Musculoskeletal: Normal digits and nails by inspection. No clubbing.  Neurologic Examination  Mental status/Cognition: Alert, oriented to self, place, month and year, good attention.  Speech/language: Mildly dysarthric speech, fluent, comprehension intact, object naming intact, repetition intact.  Cranial nerves:   CN II Pupils equal and reactive to light, no VF deficits    CN III,IV,VI EOM intact, no gaze preference or deviation, no nystagmus    CN V normal sensation in V1, V2, and V3 segments bilaterally    CN VII no asymmetry, no nasolabial fold flattening    CN VIII normal hearing to speech    CN IX & X normal palatal elevation, no uvular deviation    CN XI 5/5 head turn and 5/5 shoulder shrug bilaterally    CN XII midline tongue protrusion    Motor:  Muscle bulk: normal, tone normal, pronator drift none tremor none Mvmt Root Nerve  Muscle Right Left Comments  SA C5/6 Ax Deltoid 5 5   EF C5/6 Mc Biceps 5 5   EE C6/7/8 Rad Triceps 5 5   WF C6/7 Med FCR     WE C7/8 PIN ECU     F Ab C8/T1 U ADM/FDI 5 5   HF L1/2/3 Fem Illopsoas 5 5   KE L2/3/4 Fem Quad 5 5   DF L4/5 D Peron Tib Ant 5 5   PF S1/2 Tibial Grc/Sol 5 5    Reflexes:  Right Left Comments  Pectoralis      Biceps (C5/6) 2 2   Brachioradialis (C5/6) 2 2    Triceps (C6/7) 2 2    Patellar (L3/4) 2 2    Achilles (S1)      Hoffman      Plantar     Jaw jerk    Sensation:  Light touch Intact throughout   Pin prick    Temperature    Vibration   Proprioception    Coordination/Complex Motor:  - Finger to Nose bilaterally intact - Heel to shin intact bilaterally - Rapid alternating movement intact bilaterally - Gait: Deferred for patient's safety given lightheadedness and presyncopal episode. Labs   CBC:  Recent Labs  Lab 09/06/21  2226  WBC 6.9  NEUTROABS 4.1  HGB 13.8  HCT 40.8  MCV 93.2  PLT 263    Basic Metabolic Panel:  Lab Results  Component  Value Date   NA 141 06/20/2021   K 4.6 06/20/2021   CO2 29 06/20/2021   GLUCOSE 101 (H) 06/20/2021   BUN 15 06/20/2021   CREATININE 1.06 06/20/2021   CALCIUM 9.9 06/20/2021   GFRNONAA >90 11/25/2013   GFRAA >90 11/25/2013   Lipid Panel:  Lab Results  Component Value Date   LDLCALC 98 06/20/2021   HgbA1c: No results found for: HGBA1C Urine Drug Screen:     Component Value Date/Time   LABOPIA NONE DETECTED 11/25/2013 0127   COCAINSCRNUR NONE DETECTED 11/25/2013 0127   LABBENZ NONE DETECTED 11/25/2013 0127   AMPHETMU NONE DETECTED 11/25/2013 0127   THCU POSITIVE (A) 11/25/2013 0127   LABBARB NONE DETECTED 11/25/2013 0127    Alcohol Level     Component Value Date/Time   ETH 63 (H) 11/24/2013 2244    CT Head without contrast(Personally reviewed): CTH was negative for a large hypodensity concerning for a large territory infarct or hyperdensity concerning for an ICH  Impression   DERALD LORGE is a 55 y.o. male with PMH significant for allergies, prior chickenpox who presents with presyncopal episode followed by slurring of his speech.  He also endorses dry mouth.  His slurred speech speech resolved with swiling some water in his mouth with NIHSS of 0.  No further inpatient neurological workup at this time. Low suspicion for stroke. Recommendations  - We will signoff. Please feel free to contact us with any questions or concerns. ______________________________________________________________________ Plan discussed with Dr. Almyra Free.  Thank you for the opportunity to take part in the care of this patient. If you have any further questions, please contact the neurology consultation attending.  Signed,  [facility] Pager Number [phone] _ _ _   _ __   _ __ _ _  __ __   _ __   __ _

## 2021-09-06 NOTE — ED Triage Notes (Signed)
EMS arrival. Eaten dinner began feeling discombobulated. Called. Verbalizes feels "good" now. Received 419mL enroute.

## 2021-09-06 NOTE — ED Provider Notes (Addendum)
[facility] EMERGENCY DEPARTMENT Provider Note   CSN: [id number] Arrival date & time: 09/06/21  2135     History Chief Complaint  Patient presents with   Near Syncope    Blake Brown is a 55 y.o. male.  Patient presents ER chief complaint of lightheadedness dizziness and nearly passing out.  The head just finished dinner with his wife when patient's wife states that he became unresponsive looking upwards and looked like he was about to pass out.  She states that he did not fully pass out but symptoms lasted about a minute or 2 before he became more awake and alert.  Otherwise appeared very fatigued since the episode.  Patient himself states he felt dizzy lightheaded but denies passing out.  Denies any headache or chest pain or abdominal pain.  Upon arrival to the ER the wife states that she is noticing slurred speech.  She states that she is a Electrical engineer and is concerned that his speech pattern is different starting upon arrival to the ER.  Patient himself however denies any abnormal speech and feels like his speech is normal.  No other weakness reported.  Wife also states the patient had several glasses of alcohol.      Past Medical History:  Diagnosis Date   Allergy    History of chicken pox     Patient Active Problem List   Diagnosis Date Noted   Physical exam 06/07/2018   Overweight (BMI 25.0-29.9) 03/12/2017   Syncope 11/25/2013    Past Surgical History:  Procedure Laterality Date   TONSILLECTOMY AND ADENOIDECTOMY         Family History  Problem Relation Age of Onset   Dementia Mother    Cancer Father        mesothelioma   Diabetes Father     Social History   Tobacco Use   Smoking status: Former   Smokeless tobacco: Never  Scientific laboratory technician Use: Never used  Substance Use Topics   Alcohol use: Yes   Drug use: No    Home Medications Prior to Admission medications   Medication Sig Start Date End Date Taking? Authorizing  Provider  ibuprofen (ADVIL) 200 MG tablet Take 400 mg by mouth every 6 (six) hours as needed for headache or moderate pain.   Yes [provider]  Pseudoeph-Doxylamine-DM-APAP (NYQUIL PO) Take 2 capsules by mouth at bedtime as needed (cold symptoms).   Yes [provider]  Pseudoephedrine-APAP-DM (DAYQUIL PO) Take 2 capsules by mouth daily as needed (cold symptoms).   Yes [provider]    Allergies    Patient has no known allergies.  Review of Systems   Review of Systems  Constitutional:  Negative for fever.  HENT:  Negative for ear pain and sore throat.   Eyes:  Negative for pain.  Respiratory:  Negative for cough.   Cardiovascular:  Negative for chest pain.  Gastrointestinal:  Negative for abdominal pain.  Genitourinary:  Negative for flank pain.  Musculoskeletal:  Negative for back pain.  Skin:  Negative for color change and rash.  Neurological:  Positive for light-headedness.  All other systems reviewed and are negative.  Physical Exam Updated Vital Signs BP 96/65 (BP Location: Right Arm)    Pulse (!) 50    Temp (!) 97.5 F (36.4 C) (Oral)    Resp 17    SpO2 95%   Physical Exam Constitutional:      Appearance: He is well-developed.  HENT:     Head: Normocephalic.     Nose: Nose normal.  Eyes:     Extraocular Movements: Extraocular movements intact.  Cardiovascular:     Rate and Rhythm: Normal rate.  Pulmonary:     Effort: Pulmonary effort is normal.  Skin:    Coloration: Skin is not jaundiced.  Neurological:     General: No focal deficit present.     Mental Status: He is alert and oriented to person, place, and time. Mental status is at baseline.     Cranial Nerves: No cranial nerve deficit.     Motor: No weakness.     Comments: Speech appears normal here in the ER.  No slurred speech noted.  No facial droop noted.  No weakness of extremity noted.    ED Results / Procedures / Treatments   Labs (all labs ordered are listed, but only  abnormal results are displayed) Labs Reviewed  COMPREHENSIVE METABOLIC PANEL - Abnormal; Notable for the following components:      Result Value   Glucose, Bld 130 (*)    Total Protein 6.2 (*)    All other components within normal limits  ETHANOL - Abnormal; Notable for the following components:   Alcohol, Ethyl (B) 49 (*)    All other components within normal limits  URINALYSIS, ROUTINE W REFLEX MICROSCOPIC - Abnormal; Notable for the following components:   Specific Gravity, Urine >1.030 (*)    Hgb urine dipstick TRACE (*)    All other components within normal limits  RESP PANEL BY RT-PCR (FLU A&B, COVID) ARPGX2  CBC WITH DIFFERENTIAL/PLATELET  MAGNESIUM  PROTIME-INR  APTT  URINALYSIS, MICROSCOPIC (REFLEX)  RAPID URINE DRUG SCREEN, HOSP PERFORMED  I-STAT CHEM 8, ED  TROPONIN I (HIGH SENSITIVITY)    EKG EKG Interpretation  Date/Time:  Friday September 06 2021 21:44:09 EST Ventricular Rate:  51 PR Interval:  140 QRS Duration: 108 QT Interval:  449 QTC Calculation: 414 R Axis:   83 Text Interpretation: Sinus rhythm Atrial premature complex RSR' in V1 or V2, right VCD or RVH Minimal ST elevation, anterior leads Confirmed by Thamas Jaegers ([id number]) on 09/06/2021 9:52:04 PM  Radiology CT HEAD CODE STROKE WO CONTRAST  Result Date: 09/06/2021 CLINICAL DATA:  Code stroke.  Feeling discombobulated. EXAM: CT HEAD WITHOUT CONTRAST TECHNIQUE: Contiguous axial images were obtained from the base of the skull through the vertex without intravenous contrast. COMPARISON:  11/24/2013 FINDINGS: Brain: There is no mass, hemorrhage or extra-axial collection. The size and configuration of the ventricles and extra-axial CSF spaces are normal. The brain parenchyma is normal, without evidence of acute or chronic infarction. Vascular: No abnormal hyperdensity of the major intracranial arteries or dural venous sinuses. No intracranial atherosclerosis. Skull: The visualized skull base, calvarium and  extracranial soft tissues are normal. Sinuses/Orbits: No fluid levels or advanced mucosal thickening of the visualized paranasal sinuses. No mastoid or middle ear effusion. The orbits are normal. ASPECTS [facility] Stroke Program Early CT Score) - Ganglionic level infarction (caudate, lentiform nuclei, internal capsule, insula, M1-M3 cortex): 7 - Supraganglionic infarction (M4-M6 cortex): 3 Total score (0-10 with 10 being normal): 10 IMPRESSION: 1. Normal head CT. 2. ASPECTS is 10. These results were communicated to Dr. Donnetta Simpers at 10:26 pm on 09/06/2021 by text page via the [facility] messaging system. Electronically Signed   By: Ulyses Jarred M.D.   On: 09/06/2021 22:27    Procedures .Critical Care Performed by: Luna Fuse, MD Authorized by: Luna Fuse, MD  Critical care provider statement:    Critical care time (minutes):  40   Critical care time was exclusive of:  Separately billable procedures and treating other patients and teaching time   Critical care was necessary to treat or prevent imminent or life-threatening deterioration of the following conditions:  Circulatory failure Comments:     Stroke alert activation, hypotensive episode, resolved.   Medications Ordered in ED Medications  sodium chloride 0.9 % bolus 1,000 mL (1,000 mLs Intravenous New Bag/Given 09/06/21 2334)    ED Course  I have reviewed the triage vital signs and the nursing notes.  Pertinent labs & imaging results that were available during my care of the patient were reviewed by me and considered in my medical decision making (see chart for details).    MDM Rules/Calculators/A&P                         Given family's concern a stroke alert was activated.  Patient seen by neurology and also a stroke alert was canceled.  Patient given some water to drink and speech pattern does not appear slurred.  CT head pursued with no acute findings.  Patient's blood pressure appears low, given IV fluid  resuscitation and labs ordered.  Labs unremarkable white count normal chemistry normal alcohol mildly elevated.  After IV fluids agitation patient is feeling much better and ambulatory without assistance.  Discharged home in stable condition advised outpatient follow-up with her doctor within the week.  Advising immediate return for recurrent or worsening symptoms or any additional concerns.  Final Clinical Impression(s) / ED Diagnoses Final diagnoses:  Near syncope  Hypotensive episode    Rx / DC Orders ED Discharge Orders     None        Luna Fuse, MD 09/06/21 2311    Luna Fuse, MD 09/06/21 2312    Luna Fuse, MD 09/07/21 [phone]

## 2021-09-07 LAB — URINALYSIS, ROUTINE W REFLEX MICROSCOPIC
Bilirubin Urine: NEGATIVE
Glucose, UA: NEGATIVE mg/dL
Ketones, ur: NEGATIVE mg/dL
Leukocytes,Ua: NEGATIVE
Nitrite: NEGATIVE
Protein, ur: NEGATIVE mg/dL
Specific Gravity, Urine: >1.03 — ABNORMAL HIGH (ref 1.005–1.030)
pH: 5.5 (ref 5.0–8.0)

## 2021-09-07 LAB — RAPID URINE DRUG SCREEN, HOSP PERFORMED
Amphetamines: NOT DETECTED
Barbiturates: NOT DETECTED
Benzodiazepines: NOT DETECTED
Cocaine: NOT DETECTED
Opiates: NOT DETECTED
Tetrahydrocannabinol: POSITIVE — AB

## 2021-09-07 LAB — URINALYSIS, MICROSCOPIC (REFLEX): Bacteria, UA: NONE SEEN

## 2021-09-07 NOTE — Discharge Instructions (Signed)
Call your primary care doctor or specialist as discussed in the next 2-3 days.   Return immediately back to the ER if:  Your symptoms worsen within the next 12-24 hours. You develop new symptoms such as new fevers, persistent vomiting, new pain, shortness of breath, or new weakness or numbness, or if you have any other concerns.  

## 2021-09-17 ENCOUNTER — Telehealth: Payer: Self-pay

## 2021-09-17 NOTE — Telephone Encounter (Signed)
Caller name:Blake Brown   On DPR? :Yes  Call back [phone]  Provider they see: Birdie Riddle   Reason for call:Pt had a fainting spell over the holiday and will not discuss nothing with me for me to get further information wants Tabori to call him

## 2021-09-17 NOTE — Telephone Encounter (Signed)
Pt had fainting spell over the weekend but was unwilling to provide further information to Tami on first call, has insisted that you call him, I am positive you will want an appointment, do you wish to call him prior due to his request?

## 2021-09-17 NOTE — Telephone Encounter (Signed)
Pt needs to schedule an appt to be evaluated.  If he's not willing to share information over the phone, we will do it in person

## 2021-09-17 NOTE — Telephone Encounter (Signed)
Pt needs in person visit to discuss incident, Dr Birdie Riddle is unavailable to call him at this time

## 2021-09-18 NOTE — Telephone Encounter (Signed)
Pt scheduled for 10/03/21

## 2021-09-26 ENCOUNTER — Other Ambulatory Visit (HOSPITAL_COMMUNITY): Payer: Self-pay

## 2021-09-26 MED ORDER — AZITHROMYCIN 250 MG PO TABS
ORAL_TABLET | ORAL | 0 refills | Status: DC
  Filled 2021-09-26: qty 6, 5d supply, fill #0

## 2021-10-03 ENCOUNTER — Other Ambulatory Visit (HOSPITAL_COMMUNITY): Payer: Self-pay

## 2021-10-03 ENCOUNTER — Encounter: Payer: Self-pay | Admitting: Family Medicine

## 2021-10-03 ENCOUNTER — Ambulatory Visit: Payer: 59 | Admitting: Family Medicine

## 2021-10-03 VITALS — BP 102/60 | HR 63 | Temp 98.2°F | Resp 16 | Wt 230.8 lb

## 2021-10-03 DIAGNOSIS — R55 Syncope and collapse: Secondary | ICD-10-CM | POA: Diagnosis not present

## 2021-10-03 DIAGNOSIS — H66001 Acute suppurative otitis media without spontaneous rupture of ear drum, right ear: Secondary | ICD-10-CM | POA: Diagnosis not present

## 2021-10-03 DIAGNOSIS — I499 Cardiac arrhythmia, unspecified: Secondary | ICD-10-CM | POA: Diagnosis not present

## 2021-10-03 MED ORDER — AMOXICILLIN 875 MG PO TABS
875.0000 mg | ORAL_TABLET | Freq: Two times a day (BID) | ORAL | 0 refills | Status: AC
  Filled 2021-10-03: qty 20, 10d supply, fill #0

## 2021-10-03 NOTE — Patient Instructions (Signed)
Follow up as needed or as scheduled Start the Amoxicillin- twice daily w/ food- for the ear infection Drink LOTS of water to help w/ the ear pressure Daily claritin or zyrtec will help w/ congestion and drainage Your EKG is normal and just shows normal extra beats Call with any questions or concerns Hang in there!!!

## 2021-10-03 NOTE — Progress Notes (Signed)
° °  Subjective:    Patient ID: Blake Brown, male    DOB: 12/17/1965, 56 y.o.   MRN: [id number]  HPI Near syncope- pt was seen in ED 12/23 after he felt lightheaded after eating and 'looked like he was going to pass out'.  Sxs lasted ~2 minutes before he became more alert.  Had subsequent fatigue.  There was some question as to slurred speech but he had several glasses of alcohol prior to ER arrival.  CT head was normal.  Labs normal.  Pt felt much better after IV fluids.  Pt reports episode was self induced- 'i had too much to drink'.  R ear pain- sxs started ~10 days ago.  Pt has been using sinus rinse w/ some relief.  Has pressure and noise in R ear.  Sxs are constant.  + sore throat- particularly w/ swallowing.  This is relieved w/ Advil.  No headache.  Minimal nasal congestion.  Denies facial pain/pressure.  No fevers.  No body aches or chills.  No known sick contacts.   Review of Systems For ROS see HPI   This visit occurred during the SARS-CoV-2 public health emergency.  Safety protocols were in place, including screening questions prior to the visit, additional usage of staff PPE, and extensive cleaning of exam room while observing appropriate contact time as indicated for disinfecting solutions.      Objective:   Physical Exam Vitals reviewed.  Constitutional:      General: He is not in acute distress.    Appearance: Normal appearance. He is not ill-appearing.  HENT:     Head: Normocephalic and atraumatic.     Right Ear: A middle ear effusion is present. Tympanic membrane is injected, erythematous and bulging.     Left Ear: Tympanic membrane and ear canal normal.  Eyes:     Extraocular Movements: Extraocular movements intact.     Conjunctiva/sclera: Conjunctivae normal.     Pupils: Pupils are equal, round, and reactive to light.  Cardiovascular:     Rate and Rhythm: Normal rate. Rhythm irregular.  Pulmonary:     Effort: Pulmonary effort is normal. No respiratory distress.      Breath sounds: Normal breath sounds. No wheezing or rhonchi.  Musculoskeletal:     Cervical back: Normal range of motion and neck supple.  Lymphadenopathy:     Cervical: No cervical adenopathy.  Skin:    General: Skin is warm and dry.  Neurological:     General: No focal deficit present.     Mental Status: He is alert and oriented to person, place, and time.  Psychiatric:        Mood and Affect: Mood normal.        Behavior: Behavior normal.        Thought Content: Thought content normal.          Assessment & Plan:   Pre-syncope- pt reports this was self induced and due to alcohol consumption.  No further work up at this time.  R OM- new.  Pt's sxs and PE consistent w/ infxn.  Start Amox x10 days.  Pt expressed understanding and is in agreement w/ plan.   Irregular heartbeat- pt has hx of frequent PACs.  Unable to discern rhythm on sound alone so EKG was obtained.  Again shows PACs.  Pt is asymptomatic.  No further work up at this time

## 2021-10-21 ENCOUNTER — Telehealth: Payer: Self-pay

## 2021-10-21 NOTE — Telephone Encounter (Signed)
Caller name:Lucca Mielke   On DPR? :Yes  Call back [phone]  Provider they see: Birdie Riddle   Reason for call:Pt was seen on 01/19 and was put on antibiotic for ear infection, congestion everything has went away except for the ringing in the ear. I have told pt he will need appt and he wanted advice first

## 2021-10-21 NOTE — Telephone Encounter (Signed)
Pt reports sxs have all improved while on Abx but still has ringing of the ear. Any advice at this time or should pt return for another evaluation?

## 2021-10-22 NOTE — Telephone Encounter (Signed)
Pt has been informed reports hell try that for about 1 week and reports back if continued issues

## 2021-10-22 NOTE — Telephone Encounter (Signed)
Ringing in the ear can take quite awhile to improve (or may become an ongoing problem).  I would make sure he is taking a daily antihistamine like Claritin or Zyrtec to decrease congestion and improve middle ear drainage.

## 2022-01-03 DIAGNOSIS — H5213 Myopia, bilateral: Secondary | ICD-10-CM | POA: Diagnosis not present

## 2022-02-15 IMAGING — CT CT HEAD CODE STROKE
4 series · 15 of 47 positions shown, 17 images · non-contrast
Comparison: 11/24/2013

CLINICAL DATA: Code stroke.  Feeling discombobulated.

EXAM:
CT HEAD WITHOUT CONTRAST
TECHNIQUE: Contiguous axial images were obtained from the base of the skull
through the vertex without intravenous contrast.

[Series 3: head wo · axial · 0.48mm/px · z∈[-108,+12]mm · 7 of 34 slices shown, 9 images]
[im 5/34  brain]
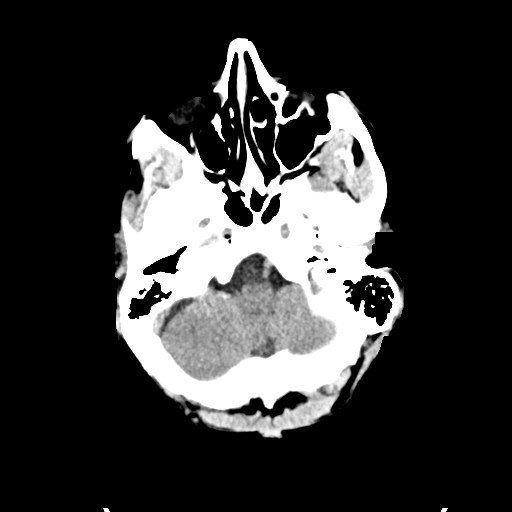
[im 5/34  bone]
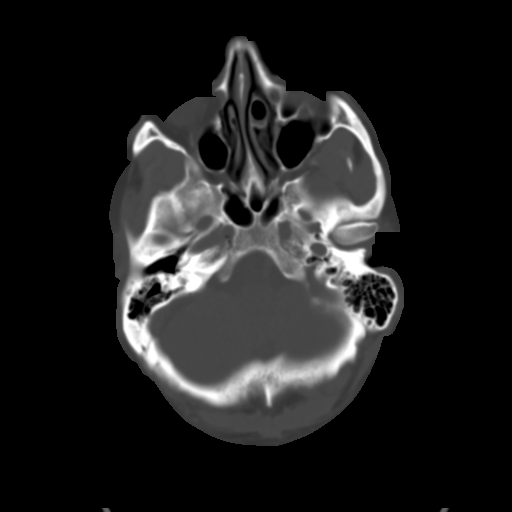
[im 9/34  brain]
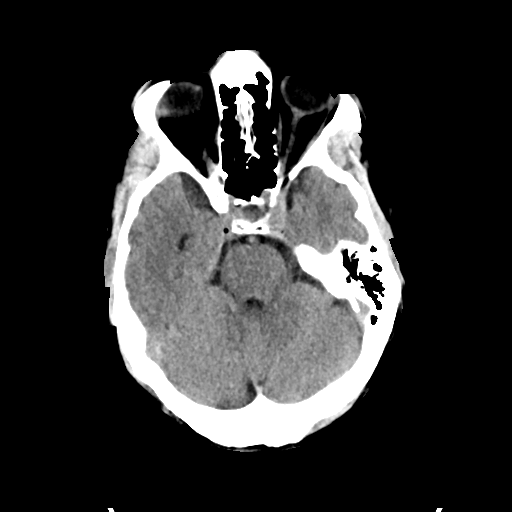
[im 13/34  brain]
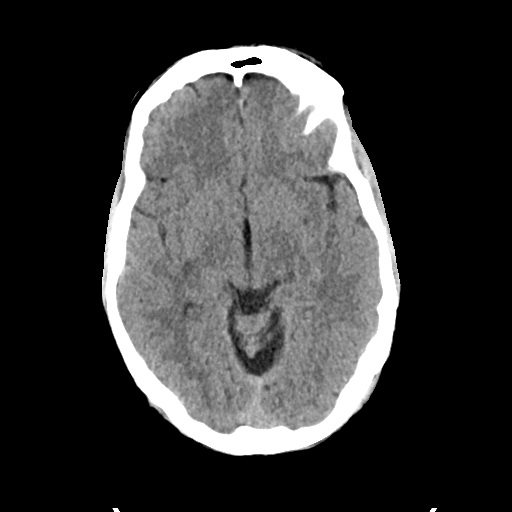
[im 17/34  brain]
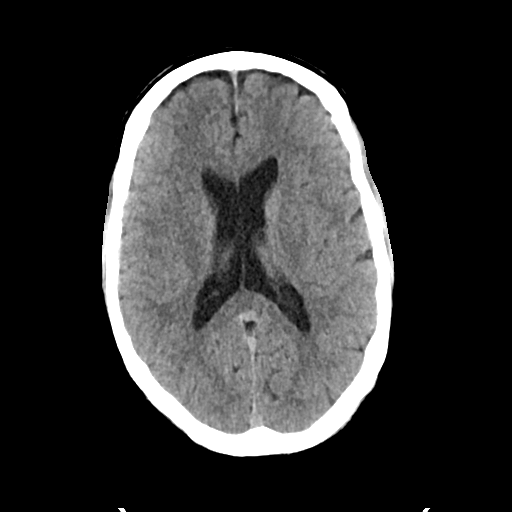
[im 21/34  brain]
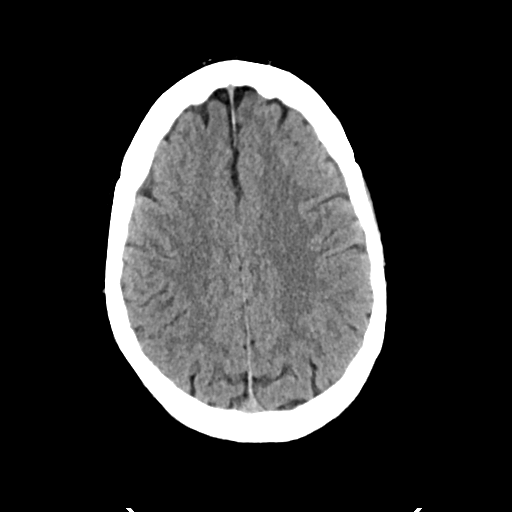
[im 21/34  bone]
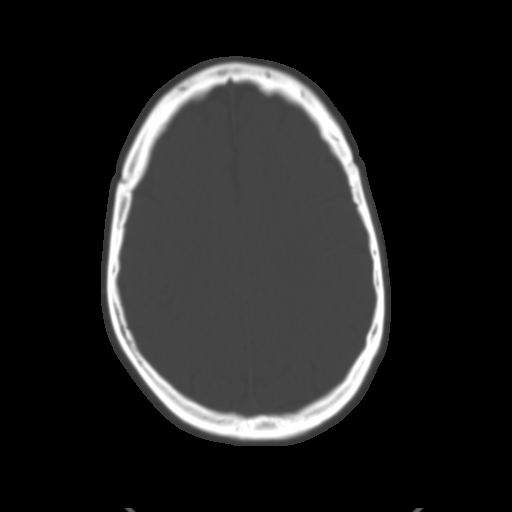
[im 25/34  brain]
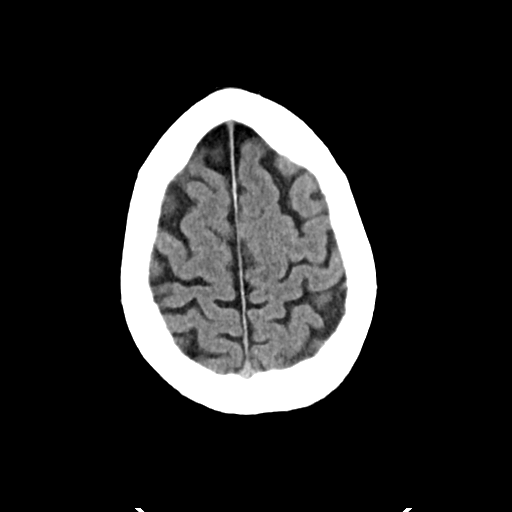
[im 29/34  brain]
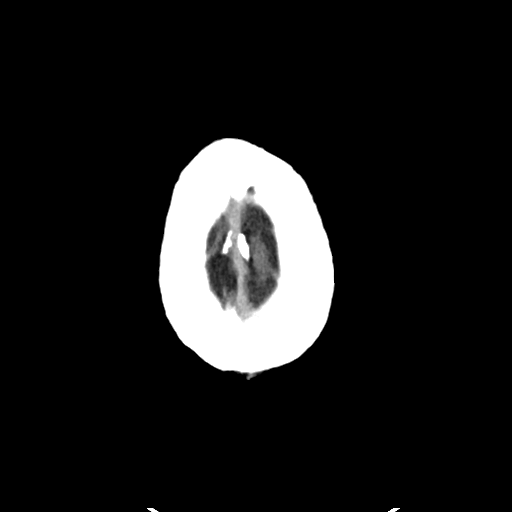

[Series 4: head bone · axial · 0.48mm/px · z∈[-112,-96]mm · 2 of 83 slices shown]
[im 9/83  bone]
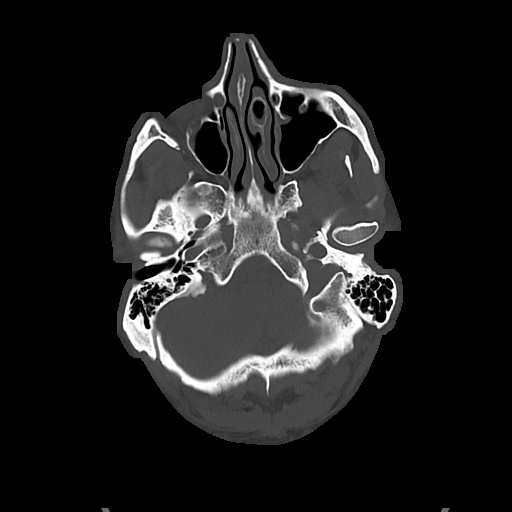
[im 17/83  bone]
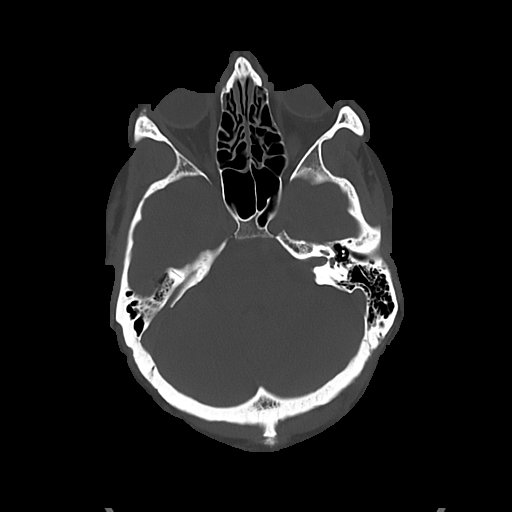

[Series 5: cor soft · coronal · 0.33mm/px · 3 of 85 slices shown]
[im 29/85  brain]
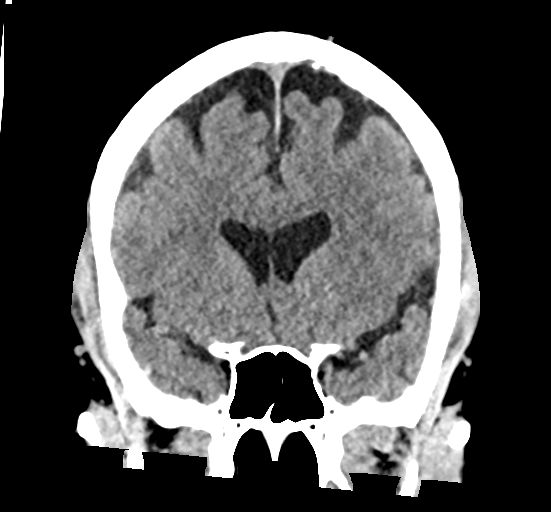
[im 38/85  brain]
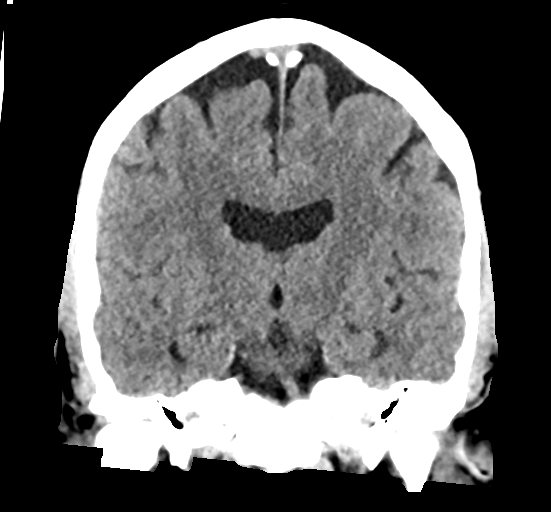
[im 47/85  brain]
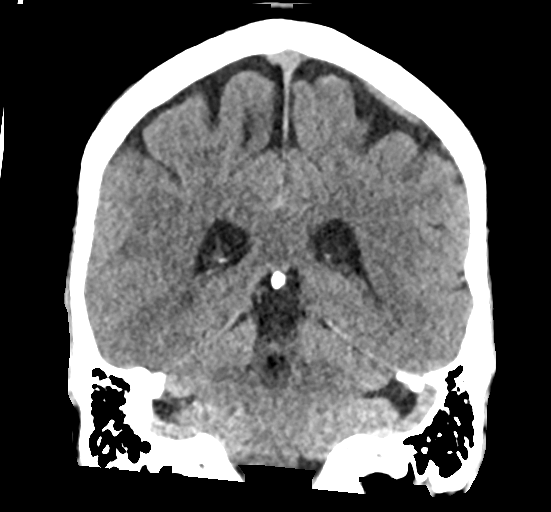

[Series 6: sag soft · sagittal · 0.33mm/px · 3 of 62 slices shown]
[im 21/62  brain]
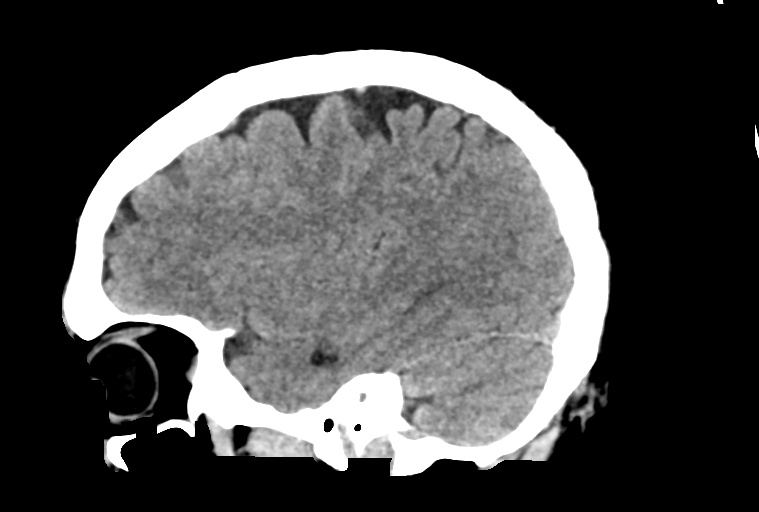
[im 31/62  brain]
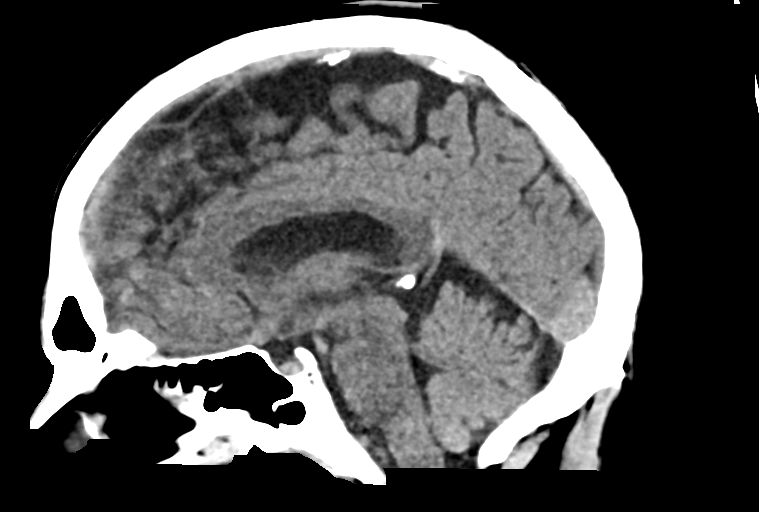
[im 41/62  brain]
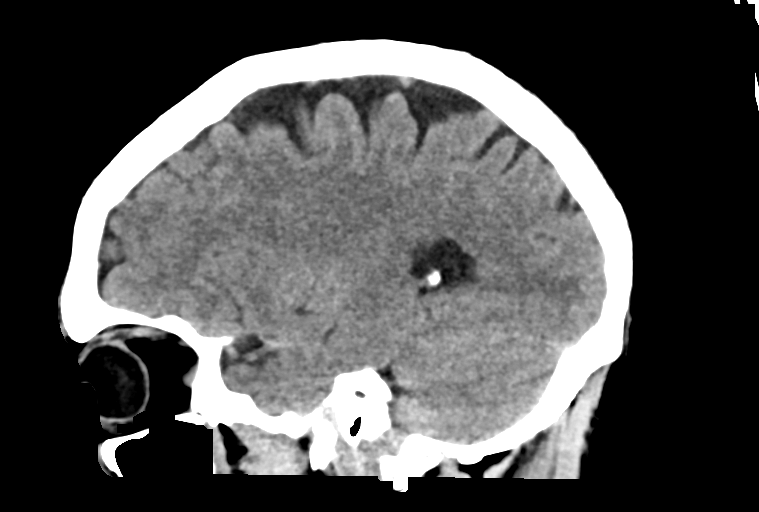

[15 of 47 positions shown; findings below may reference images not displayed]

FINDINGS: Brain: There is no mass, hemorrhage or extra-axial collection. The
size and configuration of the ventricles and extra-axial CSF spaces
are normal. The brain parenchyma is normal, without evidence of
acute or chronic infarction.

Vascular: No abnormal hyperdensity of the major intracranial
arteries or dural venous sinuses. No intracranial atherosclerosis.

Skull: The visualized skull base, calvarium and extracranial soft
tissues are normal.

Sinuses/Orbits: No fluid levels or advanced mucosal thickening of
the visualized paranasal sinuses. No mastoid or middle ear effusion.
The orbits are normal.

ASPECTS ([facility] Stroke Program Early CT Score)

- Ganglionic level infarction (caudate, lentiform nuclei, internal
capsule, insula, M1-M3 cortex): 7

- Supraganglionic infarction (M4-M6 cortex): 3

Total score (0-10 with 10 being normal): 10
IMPRESSION: 1. Normal head CT.
2. ASPECTS is 10.

These results were communicated to Dr. Hriata Silpa at [DATE]
on 09/06/2021 by text page via the [facility] messaging system.

## 2022-03-03 ENCOUNTER — Encounter: Payer: Self-pay | Admitting: Family Medicine

## 2022-03-03 ENCOUNTER — Ambulatory Visit: Payer: 59 | Admitting: Family Medicine

## 2022-03-03 ENCOUNTER — Other Ambulatory Visit (HOSPITAL_COMMUNITY): Payer: Self-pay

## 2022-03-03 VITALS — BP 130/80 | HR 80 | Temp 98.4°F | Resp 16 | Ht 75.0 in | Wt 235.1 lb

## 2022-03-03 DIAGNOSIS — J310 Chronic rhinitis: Secondary | ICD-10-CM | POA: Diagnosis not present

## 2022-03-03 MED ORDER — MOMETASONE FUROATE 50 MCG/ACT NA SUSP
2.0000 | Freq: Every day | NASAL | 12 refills | Status: DC
Start: 2022-03-03 — End: 2022-07-15
  Filled 2022-03-03: qty 17, 30d supply, fill #0
  Filled 2022-03-24 (×2): qty 17, 30d supply, fill #1

## 2022-03-03 NOTE — Patient Instructions (Signed)
Follow up as needed or as scheduled CONTINUE the Zyrtec daily ADD Nasonex- 2 sprays each nostril daily- daily Drink LOTS of fluids Call with any questions or concerns Stay Safe!  Stay Healthy! Have a great trip!!!

## 2022-03-03 NOTE — Progress Notes (Signed)
   Subjective:    Patient ID: Blake Brown, male    DOB: 1966-01-30, 56 y.o.   MRN: [id number]  HPI Congestion- sxs 'started as a cold and then morphed into my seasonal allergies and it just won't go away'.  Doesn't feel he is able to breathe normally through his nose.  Sxs started ~5-6 weeks ago.  5 days ago started taking Zyrtec w/ some relief.  No fevers.  Denies facial pain/pressure.  Denies HAs.  No ear pain.  No recent sick contacts.   Review of Systems For ROS see HPI     Objective:   Physical Exam Vitals reviewed.  Constitutional:      General: He is not in acute distress.    Appearance: Normal appearance. He is well-developed. He is not ill-appearing.  HENT:     Head: Normocephalic and atraumatic.     Nose: Mucosal edema and congestion present.     Right Turbinates: Swollen.     Left Turbinates: Swollen.     Right Sinus: No maxillary sinus tenderness or frontal sinus tenderness.     Left Sinus: No maxillary sinus tenderness or frontal sinus tenderness.  Eyes:     Conjunctiva/sclera: Conjunctivae normal.     Pupils: Pupils are equal, round, and reactive to light.  Cardiovascular:     Rate and Rhythm: Normal rate and regular rhythm.     Heart sounds: Normal heart sounds.  Pulmonary:     Effort: Pulmonary effort is normal. No respiratory distress.     Breath sounds: Normal breath sounds. No wheezing.  Musculoskeletal:     Cervical back: Normal range of motion and neck supple.  Lymphadenopathy:     Cervical: No cervical adenopathy.  Skin:    General: Skin is warm and dry.  Neurological:     Mental Status: He is alert.           Assessment & Plan:  Rhinitis- new.  Pt w/o sxs or evidence of infxn.  Suspect this is just inflammation given recent URI and seasonal allergies.  Continue Zyrtec.  Start Nasonex.  Reviewed supportive care and red flags that should prompt return.  Pt expressed understanding and is in agreement w/ plan.

## 2022-03-24 ENCOUNTER — Other Ambulatory Visit (HOSPITAL_COMMUNITY): Payer: Self-pay

## 2022-06-20 ENCOUNTER — Telehealth: Payer: Self-pay | Admitting: Family Medicine

## 2022-06-20 NOTE — Telephone Encounter (Signed)
Noted  

## 2022-06-20 NOTE — Telephone Encounter (Signed)
(   FYI) Pt physical was 06/23/22. Pt tested positive for covid. Rescheduled pt physical for October 31st.

## 2022-06-23 ENCOUNTER — Encounter: Payer: 59 | Admitting: Family Medicine

## 2022-07-15 ENCOUNTER — Ambulatory Visit (INDEPENDENT_AMBULATORY_CARE_PROVIDER_SITE_OTHER): Payer: 59 | Admitting: Family Medicine

## 2022-07-15 ENCOUNTER — Encounter: Payer: Self-pay | Admitting: Family Medicine

## 2022-07-15 ENCOUNTER — Other Ambulatory Visit (HOSPITAL_COMMUNITY): Payer: Self-pay

## 2022-07-15 VITALS — BP 116/82 | HR 55 | Temp 98.4°F | Resp 16 | Ht 75.0 in | Wt 229.4 lb

## 2022-07-15 DIAGNOSIS — Z23 Encounter for immunization: Secondary | ICD-10-CM | POA: Diagnosis not present

## 2022-07-15 DIAGNOSIS — Z1211 Encounter for screening for malignant neoplasm of colon: Secondary | ICD-10-CM | POA: Insufficient documentation

## 2022-07-15 DIAGNOSIS — E663 Overweight: Secondary | ICD-10-CM

## 2022-07-15 DIAGNOSIS — Z125 Encounter for screening for malignant neoplasm of prostate: Secondary | ICD-10-CM | POA: Diagnosis not present

## 2022-07-15 DIAGNOSIS — M19079 Primary osteoarthritis, unspecified ankle and foot: Secondary | ICD-10-CM | POA: Diagnosis not present

## 2022-07-15 DIAGNOSIS — Z0000 Encounter for general adult medical examination without abnormal findings: Secondary | ICD-10-CM | POA: Diagnosis not present

## 2022-07-15 LAB — HEPATIC FUNCTION PANEL
ALT: 17 U/L (ref 0–53)
AST: 16 U/L (ref 0–37)
Albumin: 4.6 g/dL (ref 3.5–5.2)
Alkaline Phosphatase: 55 U/L (ref 39–117)
Bilirubin, Direct: 0.1 mg/dL (ref 0.0–0.3)
Total Bilirubin: 0.7 mg/dL (ref 0.2–1.2)
Total Protein: 6.8 g/dL (ref 6.0–8.3)

## 2022-07-15 LAB — CBC WITH DIFFERENTIAL/PLATELET
Basophils Absolute: 0 10*3/uL (ref 0.0–0.1)
Basophils Relative: 0.8 % (ref 0.0–3.0)
Eosinophils Absolute: 0.2 10*3/uL (ref 0.0–0.7)
Eosinophils Relative: 4 % (ref 0.0–5.0)
HCT: 43.9 % (ref 39.0–52.0)
Hemoglobin: 14.6 g/dL (ref 13.0–17.0)
Lymphocytes Relative: 30.7 % (ref 12.0–46.0)
Lymphs Abs: 1.5 10*3/uL (ref 0.7–4.0)
MCHC: 33.3 g/dL (ref 30.0–36.0)
MCV: 90.5 fl (ref 78.0–100.0)
Monocytes Absolute: 0.5 10*3/uL (ref 0.1–1.0)
Monocytes Relative: 9.5 % (ref 3.0–12.0)
Neutro Abs: 2.7 10*3/uL (ref 1.4–7.7)
Neutrophils Relative %: 55 % (ref 43.0–77.0)
Platelets: 195 10*3/uL (ref 150.0–400.0)
RBC: 4.85 Mil/uL (ref 4.22–5.81)
RDW: 13.5 % (ref 11.5–15.5)
WBC: 4.9 10*3/uL (ref 4.0–10.5)

## 2022-07-15 LAB — BASIC METABOLIC PANEL
BUN: 14 mg/dL (ref 6–23)
CO2: 29 mEq/L (ref 19–32)
Calcium: 9.2 mg/dL (ref 8.4–10.5)
Chloride: 105 mEq/L (ref 96–112)
Creatinine, Ser: 1.05 mg/dL (ref 0.40–1.50)
GFR: 78.21 mL/min (ref >60.00)
Glucose, Bld: 58 mg/dL — ABNORMAL LOW (ref 70–99)
Potassium: 4.7 mEq/L (ref 3.5–5.1)
Sodium: 140 mEq/L (ref 135–145)

## 2022-07-15 LAB — PSA: PSA: 0.4 ng/mL (ref 0.10–4.00)

## 2022-07-15 LAB — LIPID PANEL
Cholesterol: 189 mg/dL (ref 0–200)
HDL: 57.3 mg/dL (ref >39.00)
LDL Cholesterol: 103 mg/dL — ABNORMAL HIGH (ref 0–99)
NonHDL: 132.18
Total CHOL/HDL Ratio: 3
Triglycerides: 147 mg/dL (ref 0.0–149.0)
VLDL: 29.4 mg/dL (ref 0.0–40.0)

## 2022-07-15 LAB — TSH: TSH: 1.19 u[IU]/mL (ref 0.35–5.50)

## 2022-07-15 MED ORDER — DICLOFENAC SODIUM 1 % EX GEL
2.0000 g | Freq: Four times a day (QID) | CUTANEOUS | 3 refills | Status: DC
  Filled 2022-07-15: qty 100, 13d supply, fill #0

## 2022-07-15 NOTE — Progress Notes (Signed)
   Subjective:    Patient ID: Blake Brown, male    DOB: [birth year], 56 y.o.   MRN: [id number]  HPI CPE- UTD on colonoscopy, Tdap, shingles.  Will get flu shot today.  Patient Care Team    Relationship Specialty Notifications Start End  Midge Minium, MD PCP - General Family Medicine  03/12/17   Juanita Craver, MD Consulting Physician Gastroenterology  03/12/17     Health Maintenance  Topic Date Due  . INFLUENZA VACCINE  04/15/2022  . COLONOSCOPY (Pts 45-41yr Insurance coverage will need to be confirmed)  11/19/2025  . TETANUS/TDAP  01/24/2027  . Zoster Vaccines- Shingrix  Completed  . HPV VACCINES  Aged Out  . COVID-19 Vaccine  Discontinued  . Hepatitis C Screening  Discontinued  . HIV Screening  Discontinued      Review of Systems Patient reports no vision/hearing changes, anorexia, fever ,adenopathy, persistant/recurrent hoarseness, swallowing issues, chest pain, palpitations, edema, persistant/recurrent cough, hemoptysis, dyspnea (rest,exertional, paroxysmal nocturnal), gastrointestinal  bleeding (melena, rectal bleeding), abdominal pain, excessive heart burn, GU symptoms (dysuria, hematuria, voiding/incontinence issues) syncope, focal weakness, memory loss, numbness & tingling, skin/hair/nail changes, depression, anxiety, abnormal bruising/bleeding  R foot pain- occurs at the MTP joint.  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  Occurs intermittently.     Objective:   Physical Exam General Appearance:    Alert, cooperative, no distress, appears stated age  Head:    Normocephalic, without obvious abnormality, atraumatic  Eyes:    PERRL, conjunctiva/corneas clear, EOM's intact both eyes       Ears:    Normal TM's and external ear canals, both ears  Nose:   Nares normal, septum midline, mucosa normal, no drainage   or sinus tenderness  Throat:   Lips, mucosa, and tongue normal; teeth and gums normal  Neck:   Supple, symmetrical, trachea midline, no adenopathy;        thyroid:  No enlargement/tenderness/nodules  Back:     Symmetric, no curvature, ROM normal, no CVA tenderness  Lungs:     Clear to auscultation bilaterally, respirations unlabored  Chest wall:    No tenderness or deformity  Heart:    Regular rate and rhythm, S1 and S2 normal, no murmur, rub   or gallop  Abdomen:     Soft, non-tender, bowel sounds active all four quadrants,    no masses, no organomegaly  Genitalia:    deferred  Rectal:    Extremities:   Extremities normal, atraumatic, no cyanosis or edema  Pulses:   2+ and symmetric all extremities  Skin:   Skin color, texture, turgor normal, no rashes or lesions  Lymph nodes:   Cervical, supraclavicular, and axillary nodes normal  Neurologic:   CNII-XII intact. Normal strength, sensation and reflexes      throughout          Assessment & Plan:

## 2022-07-15 NOTE — Patient Instructions (Addendum)
Follow up in 1 year or as needed We'll notify you of your lab results and make any changes if needed Continue to work on healthy diet and regular exercise- you're doing great!!! We'll call you with your Podiatry appt for the foot pain USE the Blu Emu or Voltaren gel as needed for pain Call with any questions or concerns Stay Safe!  Stay Healthy! Happy Fall!!

## 2022-07-15 NOTE — Assessment & Plan Note (Signed)
Pt's PE WNL.  UTD on colonoscopy, tdap, shingles.  Flu shot given today.  Check labs.  Anticipatory guidance provided.

## 2022-07-15 NOTE — Assessment & Plan Note (Signed)
Pt is down 6 lbs since last visit.  Applauded his efforts.  Check labs to risk stratify.  Will follow.

## 2022-07-16 NOTE — Progress Notes (Signed)
Informed pt of lab results  

## 2022-07-22 ENCOUNTER — Ambulatory Visit (INDEPENDENT_AMBULATORY_CARE_PROVIDER_SITE_OTHER): Payer: 59 | Admitting: Podiatry

## 2022-07-22 ENCOUNTER — Ambulatory Visit (INDEPENDENT_AMBULATORY_CARE_PROVIDER_SITE_OTHER): Payer: 59

## 2022-07-22 DIAGNOSIS — M778 Other enthesopathies, not elsewhere classified: Secondary | ICD-10-CM

## 2022-07-22 DIAGNOSIS — M05771 Rheumatoid arthritis with rheumatoid factor of right ankle and foot without organ or systems involvement: Secondary | ICD-10-CM

## 2022-07-22 DIAGNOSIS — M2021 Hallux rigidus, right foot: Secondary | ICD-10-CM

## 2022-07-22 NOTE — Progress Notes (Unsigned)
Subjective:   Patient ID: Blake Brown, male   DOB: 56 y.o.   MRN: [id number]   HPI Chief Complaint  Patient presents with   Arthritis    Right foot bunion pain started 5 years ago, Rate of pain 8 out of 10, sharp pain, X-rays taken today     ROS  56 year old male presents the office today with above concerns.  He states that he had injuries remotely as he had injured his feet as he goes barefoot but no recent injury.  Has tried blue emu.  His PCP prescribed Voltaren but has not yet used it.  He is swelling on the big toe joint.  No other treatment.  No other concerns.     Objective:  Physical Exam  General: AAO x3, NAD  Dermatological: Skin is warm, dry and supple bilateral. There are no open sores, no preulcerative lesions, no rash or signs of infection present.  Vascular: Dorsalis Pedis artery and Posterior Tibial artery pedal pulses are 2/4 bilateral with immedate capillary fill time. There is no pain with calf compression, swelling, warmth, erythema.   Neruologic: Grossly intact via light touch bilateral.   Musculoskeletal: There is tenderness palpation on the right first MPJ.  Significant dorsal spurring is noted.  Localized edema of the first MPJ but there is no erythema or warmth.  There is no other areas of discomfort.  Crepitation with first MPJ range of motion.  Muscular strength 5/5 in all groups tested bilateral.  Gait: Unassisted, Nonantalgic.       Assessment:   Hallux rigidus, capsulitis right first MPJ     Plan:  -Treatment options discussed including all alternatives, risks, and complications -Etiology of symptoms were discussed -X-rays were obtained and reviewed with the patient.  3 views right foot were obtained.  No subacute fracture.  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 -We discussed both conservative as well as surgical treatment options.  Conservatively discussed anti-inflammatories as needed, custom orthotics with a more extension.   Surgical we discussed first MTPJ arthrodesis.  We will continue conservative care for now with no improvement then proceed with surgery.  He was measured for custom orthotics today.    Trula Slade DPM

## 2022-08-11 ENCOUNTER — Other Ambulatory Visit: Payer: Self-pay | Admitting: Podiatry

## 2022-08-11 DIAGNOSIS — M778 Other enthesopathies, not elsewhere classified: Secondary | ICD-10-CM

## 2022-08-11 DIAGNOSIS — M05771 Rheumatoid arthritis with rheumatoid factor of right ankle and foot without organ or systems involvement: Secondary | ICD-10-CM

## 2022-08-11 DIAGNOSIS — M2021 Hallux rigidus, right foot: Secondary | ICD-10-CM

## 2022-08-25 DIAGNOSIS — L821 Other seborrheic keratosis: Secondary | ICD-10-CM | POA: Diagnosis not present

## 2022-08-25 DIAGNOSIS — D2271 Melanocytic nevi of right lower limb, including hip: Secondary | ICD-10-CM | POA: Diagnosis not present

## 2022-08-25 DIAGNOSIS — D2261 Melanocytic nevi of right upper limb, including shoulder: Secondary | ICD-10-CM | POA: Diagnosis not present

## 2022-08-25 DIAGNOSIS — Z85828 Personal history of other malignant neoplasm of skin: Secondary | ICD-10-CM | POA: Diagnosis not present

## 2022-08-25 DIAGNOSIS — D2262 Melanocytic nevi of left upper limb, including shoulder: Secondary | ICD-10-CM | POA: Diagnosis not present

## 2022-08-25 DIAGNOSIS — L738 Other specified follicular disorders: Secondary | ICD-10-CM | POA: Diagnosis not present

## 2022-08-25 DIAGNOSIS — D2272 Melanocytic nevi of left lower limb, including hip: Secondary | ICD-10-CM | POA: Diagnosis not present

## 2022-08-25 DIAGNOSIS — D225 Melanocytic nevi of trunk: Secondary | ICD-10-CM | POA: Diagnosis not present

## 2022-08-25 DIAGNOSIS — D1801 Hemangioma of skin and subcutaneous tissue: Secondary | ICD-10-CM | POA: Diagnosis not present

## 2022-09-04 ENCOUNTER — Ambulatory Visit: Payer: 59 | Admitting: *Deleted

## 2022-09-04 DIAGNOSIS — M2021 Hallux rigidus, right foot: Secondary | ICD-10-CM

## 2022-09-04 DIAGNOSIS — M778 Other enthesopathies, not elsewhere classified: Secondary | ICD-10-CM

## 2022-09-04 NOTE — Progress Notes (Signed)
Patient presents today to pick up custom molded foot orthotics recommended by Dr. Jacqualyn Posey.   Orthotics were dispensed and fit was satisfactory. Reviewed instructions for break-in and wear. Written instructions given to patient.  Patient will follow up as needed.   [facility] - order # [id number]

## 2023-07-17 ENCOUNTER — Encounter: Payer: Self-pay | Admitting: Family Medicine

## 2023-07-17 ENCOUNTER — Ambulatory Visit (INDEPENDENT_AMBULATORY_CARE_PROVIDER_SITE_OTHER): Payer: BC Managed Care – PPO | Admitting: Family Medicine

## 2023-07-17 VITALS — BP 132/82 | HR 68 | Temp 97.8°F | Ht 74.0 in | Wt 225.0 lb

## 2023-07-17 DIAGNOSIS — Z1159 Encounter for screening for other viral diseases: Secondary | ICD-10-CM | POA: Diagnosis not present

## 2023-07-17 DIAGNOSIS — Z125 Encounter for screening for malignant neoplasm of prostate: Secondary | ICD-10-CM

## 2023-07-17 DIAGNOSIS — E663 Overweight: Secondary | ICD-10-CM

## 2023-07-17 DIAGNOSIS — Z0000 Encounter for general adult medical examination without abnormal findings: Secondary | ICD-10-CM | POA: Diagnosis not present

## 2023-07-17 DIAGNOSIS — Z23 Encounter for immunization: Secondary | ICD-10-CM

## 2023-07-17 DIAGNOSIS — Z114 Encounter for screening for human immunodeficiency virus [HIV]: Secondary | ICD-10-CM

## 2023-07-17 LAB — BASIC METABOLIC PANEL
BUN: 12 mg/dL (ref 6–23)
CO2: 29 meq/L (ref 19–32)
Calcium: 9.9 mg/dL (ref 8.4–10.5)
Chloride: 105 meq/L (ref 96–112)
Creatinine, Ser: 1.14 mg/dL (ref 0.40–1.50)
GFR: 70.37 mL/min (ref >60.00)
Glucose, Bld: 101 mg/dL — ABNORMAL HIGH (ref 70–99)
Potassium: 5 meq/L (ref 3.5–5.1)
Sodium: 143 meq/L (ref 135–145)

## 2023-07-17 LAB — CBC WITH DIFFERENTIAL/PLATELET
Basophils Absolute: 0.1 10*3/uL (ref 0.0–0.1)
Basophils Relative: 1.3 % (ref 0.0–3.0)
Eosinophils Absolute: 0.3 10*3/uL (ref 0.0–0.7)
Eosinophils Relative: 5.1 % — ABNORMAL HIGH (ref 0.0–5.0)
HCT: 46.5 % (ref 39.0–52.0)
Hemoglobin: 15.3 g/dL (ref 13.0–17.0)
Lymphocytes Relative: 28.7 % (ref 12.0–46.0)
Lymphs Abs: 1.6 10*3/uL (ref 0.7–4.0)
MCHC: 32.8 g/dL (ref 30.0–36.0)
MCV: 92.5 fL (ref 78.0–100.0)
Monocytes Absolute: 0.4 10*3/uL (ref 0.1–1.0)
Monocytes Relative: 7.8 % (ref 3.0–12.0)
Neutro Abs: 3.2 10*3/uL (ref 1.4–7.7)
Neutrophils Relative %: 57.1 % (ref 43.0–77.0)
Platelets: 239 10*3/uL (ref 150.0–400.0)
RBC: 5.03 Mil/uL (ref 4.22–5.81)
RDW: 13.9 % (ref 11.5–15.5)
WBC: 5.7 10*3/uL (ref 4.0–10.5)

## 2023-07-17 LAB — HEPATIC FUNCTION PANEL
ALT: 16 U/L (ref 0–53)
AST: 21 U/L (ref 0–37)
Albumin: 4.8 g/dL (ref 3.5–5.2)
Alkaline Phosphatase: 58 U/L (ref 39–117)
Bilirubin, Direct: 0.1 mg/dL (ref 0.0–0.3)
Total Bilirubin: 0.7 mg/dL (ref 0.2–1.2)
Total Protein: 7.1 g/dL (ref 6.0–8.3)

## 2023-07-17 LAB — LIPID PANEL
Cholesterol: 229 mg/dL — ABNORMAL HIGH (ref 0–200)
HDL: 71 mg/dL (ref >39.00)
LDL Cholesterol: 136 mg/dL — ABNORMAL HIGH (ref 0–99)
NonHDL: 157.98
Total CHOL/HDL Ratio: 3
Triglycerides: 112 mg/dL (ref 0.0–149.0)
VLDL: 22.4 mg/dL (ref 0.0–40.0)

## 2023-07-17 LAB — TSH: TSH: 1.21 u[IU]/mL (ref 0.35–5.50)

## 2023-07-17 LAB — PSA: PSA: 0.64 ng/mL (ref 0.10–4.00)

## 2023-07-17 NOTE — Assessment & Plan Note (Signed)
Pt's PE WNL.  UTD on colonoscopy, Tdap.  Flu shot given today.  Check labs.  Anticipatory guidance provided.  

## 2023-07-17 NOTE — Patient Instructions (Signed)
Follow up in 1 year or as needed We'll notify you of your lab results and make any changes if needed Keep up the good work on healthy diet and regular exercise- you look great! Call with any questions or concerns Stay Safe!  Stay Healthy! Happy Fall!!!

## 2023-07-17 NOTE — Progress Notes (Signed)
   Subjective:    Patient ID: Blake Brown, male    DOB: 1966/09/04, 57 y.o.   MRN: [id number]  HPI CPE- UTD on colonoscopy, Tdap.  Pt will get flu today  Patient Care Team    Relationship Specialty Notifications Start End  Sheliah Hatch, MD PCP - General Family Medicine  03/12/17   Charna Elizabeth, MD Consulting Physician Gastroenterology  03/12/17   [place] [facility]    07/17/23   [place]    07/17/23      Health Maintenance  Topic Date Due   HIV Screening  Never done   Hepatitis C Screening  Never done   INFLUENZA VACCINE  04/16/2023   COVID-19 Vaccine (5 - 2023-24 season) 05/17/2023   Colonoscopy  11/19/2025   DTaP/Tdap/Td (2 - Td or Tdap) 01/24/2027   Zoster Vaccines- Shingrix  Completed   HPV VACCINES  Aged Out       Review of Systems Patient reports no vision/hearing changes, anorexia, fever ,adenopathy, persistant/recurrent hoarseness, swallowing issues, chest pain, palpitations, edema, persistant/recurrent cough, hemoptysis, dyspnea (rest,exertional, paroxysmal nocturnal), gastrointestinal  bleeding (melena, rectal bleeding), abdominal pain, excessive heart burn, GU symptoms (dysuria, hematuria, voiding/incontinence issues) syncope, focal weakness, memory loss, numbness & tingling, skin/hair/nail changes, depression, anxiety, abnormal bruising/bleeding, musculoskeletal symptoms/signs.     Objective:   Physical Exam General Appearance:    Alert, cooperative, no distress, appears stated age  Head:    Normocephalic, without obvious abnormality, atraumatic  Eyes:    PERRL, conjunctiva/corneas clear, EOM's intact both eyes       Ears:    Normal TM's and external ear canals, both ears  Nose:   Nares normal, septum midline, mucosa normal, no drainage   or sinus tenderness  Throat:   Lips, mucosa, and tongue normal; teeth and gums normal  Neck:   Supple, symmetrical, trachea midline, no adenopathy;       thyroid:  No  enlargement/tenderness/nodules  Back:     Symmetric, no curvature, ROM normal, no CVA tenderness  Lungs:     Clear to auscultation bilaterally, respirations unlabored  Chest wall:    No tenderness or deformity  Heart:    Regular rate and rhythm, S1 and S2 normal, no murmur, rub   or gallop  Abdomen:     Soft, non-tender, bowel sounds active all four quadrants,    no masses, no organomegaly  Genitalia:    deferred  Rectal:    Extremities:   Extremities normal, atraumatic, no cyanosis or edema  Pulses:   2+ and symmetric all extremities  Skin:   Skin color, texture, turgor normal, no rashes or lesions  Lymph nodes:   Cervical, supraclavicular, and axillary nodes normal  Neurologic:   CNII-XII intact. Normal strength, sensation and reflexes      throughout          Assessment & Plan:

## 2023-07-18 LAB — HEPATITIS C ANTIBODY: Hepatitis C Ab: NONREACTIVE

## 2023-07-18 LAB — HIV ANTIBODY (ROUTINE TESTING W REFLEX): HIV 1&2 Ab, 4th Generation: NONREACTIVE

## 2023-07-20 ENCOUNTER — Telehealth: Payer: Self-pay

## 2023-07-20 NOTE — Telephone Encounter (Signed)
-----   Message from Neena Rhymes sent at 07/20/2023  7:52 AM EST ----- Labs look good.  Total cholesterol and LDL (bad cholesterol) have both increased since last year but thankfully your ratio of good to bad remains outstanding.  No changes at this time

## 2023-11-24 ENCOUNTER — Telehealth: Payer: Self-pay

## 2023-11-24 NOTE — Telephone Encounter (Signed)
 Copied from CRM [id number]. Topic: Clinical - Medical Advice >> Nov 24, 2023 12:33 PM Isabell A wrote: Reason for CRM: Patient states he is expecting his grandchild soon & his daughter wont let him hold him until he find out the date of his last DTaP vaccine - if it was less than 5 years ago he would like to get it again with Dr.Tabori's consent. Patient is requesting a callback with this information.

## 2023-11-24 NOTE — Telephone Encounter (Signed)
 Pt called back and I relayed Dr. Rennis Golden message. Pt did not have any follow-up questions and expressed understanding.

## 2023-11-24 NOTE — Telephone Encounter (Signed)
 The Tdap recommendation is every 10 yrs- even for new grandparents/parents.  The only exception is the pregnant mother who should receive the vaccine during each pregnancy in the 3rd trimester to provide the newborn w/ immunity

## 2023-11-24 NOTE — Telephone Encounter (Signed)
 Called patient to discuss message from Dr. Beverely Low, Left VM for patient to return call.

## 2024-02-24 ENCOUNTER — Encounter: Payer: Self-pay | Admitting: Family Medicine

## 2024-02-24 ENCOUNTER — Ambulatory Visit (INDEPENDENT_AMBULATORY_CARE_PROVIDER_SITE_OTHER): Payer: Self-pay | Admitting: Family Medicine

## 2024-02-24 VITALS — BP 120/64 | HR 65 | Ht 74.0 in | Wt 217.2 lb

## 2024-02-24 DIAGNOSIS — M10071 Idiopathic gout, right ankle and foot: Secondary | ICD-10-CM | POA: Diagnosis not present

## 2024-02-24 MED ORDER — NAPROXEN 500 MG PO TABS: 500.0000 mg | ORAL_TABLET | Freq: Two times a day (BID) | ORAL | 0 refills | Status: DC

## 2024-02-24 NOTE — Progress Notes (Signed)
   Subjective:    Patient ID: Blake Brown, male    DOB: 1966-02-29, 58 y.o.   MRN: [id number]  HPI Toe pain- sxs started months ago in MTP joints.  Had sound wave tx to both feet.  L foot has resolved, R foot improved considerably.  This past week was at the beach and drank 'a ton of beer and was in terrible pain'.  Also ate quite a bit of seafood.  Returned home on Friday.  Pain was gone by Sunday.   Foot was red, warm to touch, swollen.  Took Advil for pain relief.   Review of Systems For ROS see HPI     Objective:   Physical Exam Vitals reviewed.  Constitutional:      General: He is not in acute distress.    Appearance: Normal appearance. He is not ill-appearing.  HENT:     Head: Normocephalic and atraumatic.  Musculoskeletal:        General: Swelling (of R 1st MTP) and tenderness (R 1st MTP) present.  Skin:    General: Skin is warm and dry.     Findings: Erythema (over R 1st MTP) present.  Neurological:     General: No focal deficit present.     Mental Status: He is alert and oriented to person, place, and time.  Psychiatric:        Mood and Affect: Mood normal.        Behavior: Behavior normal.        Thought Content: Thought content normal.           Assessment & Plan:  Gout- new.  Pt's location of pain, sxs, and inciting event (seafood, beer) are consistent w/ gout.  Will get baseline uric acid level.  Gave him a diet on low purine diet.  Encouraged increased water, ice, Naproxen prn.  Pt expressed understanding and is in agreement w/ plan.

## 2024-02-24 NOTE — Patient Instructions (Signed)
 Follow up as needed or as scheduled INCREASE your water intake to flush out the uric acid Try and limit your purine intake (see below) If the foot is red, painful, swollen, or warm- start the Naproxen twice daily w/ food ICE! Call with any questions or concerns Have a great summer!!!  Low-Purine Eating Plan A low-purine eating plan involves making food choices to limit your purine intake. Purine is a kind of uric acid. Too much uric acid in your blood can cause certain conditions, such as gout and kidney stones. Eating a low-purine diet may help control these conditions. What are tips for following this plan? Shopping Avoid buying products that contain high-fructose corn syrup. Check for this on food labels. It is commonly found in many processed foods and soft drinks. Be sure to check for it in baked goods such as cookies, canned fruits, and cereals and cereal bars. Avoid buying veal, chicken breast with skin, lamb, and organ meats such as liver. These types of meats tend to have the highest purine content. Choose dairy products. These may lower uric acid levels. Avoid certain types of fish. Not all fish and seafood have high purine content. Examples with high purine content include anchovies, trout, tuna, sardines, and salmon. Avoid buying beverages that contain alcohol, particularly beer and hard liquor. Alcohol can affect the way your body gets rid of uric acid. Meal planning  Learn which foods do or do not affect you. If you find out that a food tends to cause your gout symptoms to flare up, avoid eating that food. You can enjoy foods that do not cause problems. If you have any questions about a food item, talk with your dietitian or health care provider. Reduce the overall amount of meat in your diet. When you do eat meat, choose ones with lower purine content. Include plenty of fruits and vegetables. Although some vegetables may have a high purine content--such as asparagus, mushrooms,  spinach, or cauliflower--it has been shown that these do not contribute to uric acid blood levels as much. Consume at least 1 dairy serving a day. This has been shown to decrease uric acid levels. General information If you drink alcohol: Limit how much you have to: 0-1 drink a day for women who are not pregnant. 0-2 drinks a day for men. Know how much alcohol is in a drink. In the [place] one drink equals one 12 oz bottle of beer (355 mL), one 5 oz glass of wine (148 mL), or one 1 oz glass of hard liquor (44 mL). Drink plenty of water. Try to drink enough to keep your urine pale yellow. Fluids can help remove uric acid from your body. Work with your health care provider and dietitian to develop a plan to achieve or maintain a healthy weight. Losing weight may help reduce uric acid in your blood. What foods are recommended? The following are some types of foods that are good choices when limiting purine intake: Fresh or frozen fruits and vegetables. Whole grains, breads, cereals, and pasta. Rice. Beans, peas, legumes. Nuts and seeds. Dairy products. Fats and oils. The items listed above may not be a complete list. Talk with a dietitian about what dietary choices are best for you. What foods are not recommended? Limit your intake of foods high in purines, including: Beer and other alcohol. Meat-based gravy or sauce. Canned or fresh fish, such as: Anchovies, sardines, herring, salmon, and tuna. Mussels and scallops. Codfish, trout, and haddock. Helene Loader, veal, chicken breast with  skin, and lamb. Organ meats, such as: Liver or kidney. Tripe. Sweetbreads (thymus gland or pancreas). Wild Education officer, environmental. Yeast or yeast extract supplements. Drinks sweetened with high-fructose corn syrup, such as soda. Processed foods made with high-fructose corn syrup. The items listed above may not be a complete list of foods and beverages you should limit. Contact a dietitian for more  information. Summary Eating a low-purine diet may help control conditions caused by too much uric acid in the body, such as gout or kidney stones. Choose low-purine foods, limit alcohol, and limit high-fructose corn syrup. You will learn over time which foods do or do not affect you. If you find out that a food tends to cause your gout symptoms to flare up, avoid eating that food. This information is not intended to replace advice given to you by your health care provider. Make sure you discuss any questions you have with your health care provider. Document Revised: 08/15/2021 Document Reviewed: 08/15/2021 Elsevier Patient Education  2025 [employer].

## 2024-02-25 ENCOUNTER — Ambulatory Visit: Payer: Self-pay | Admitting: Family Medicine

## 2024-02-25 LAB — URIC ACID: Uric Acid, Serum: 5.8 mg/dL (ref 4.0–7.8)

## 2024-03-04 ENCOUNTER — Telehealth: Payer: Self-pay

## 2024-03-04 NOTE — Telephone Encounter (Signed)
 Called patient to relay Dr.Tabori's note and patient verbalized understanding. Patient states the flare ups are not everyday but he will keep in touch if they seem to get worse.

## 2024-03-04 NOTE — Telephone Encounter (Signed)
 He should take the anti-inflammatory when he has gout pain and he should take it until pain resolves/improves considerably.  Then stay off medication until he has another flare.  If flares are occurring frequently or not controlled, he needs to let me know so we can start a daily preventative medication

## 2024-03-04 NOTE — Telephone Encounter (Signed)
 Copied from CRM [id number]. Topic: Clinical - Medication Question >> Mar 04, 2024  8:56 AM Blake Brown wrote: Reason for CRM: pt called to speak with provider or nurse regarding anti inflammtory medication. Pt wants to know how often should he take the medication. Patient is requesting a call back at [phone]

## 2024-07-18 ENCOUNTER — Encounter: Payer: Commercial Managed Care - PPO | Admitting: Family Medicine

## 2024-07-26 DIAGNOSIS — Z860100 Personal history of colon polyps, unspecified: Secondary | ICD-10-CM | POA: Insufficient documentation

## 2024-07-27 ENCOUNTER — Ambulatory Visit: Payer: Self-pay | Admitting: Family Medicine

## 2024-07-27 ENCOUNTER — Encounter: Payer: Self-pay | Admitting: Family Medicine

## 2024-07-27 ENCOUNTER — Ambulatory Visit (INDEPENDENT_AMBULATORY_CARE_PROVIDER_SITE_OTHER): Payer: Commercial Managed Care - PPO | Admitting: Family Medicine

## 2024-07-27 VITALS — BP 118/64 | HR 65 | Temp 97.5°F | Resp 17 | Ht 73.25 in | Wt 210.8 lb

## 2024-07-27 DIAGNOSIS — Z0000 Encounter for general adult medical examination without abnormal findings: Secondary | ICD-10-CM

## 2024-07-27 DIAGNOSIS — E663 Overweight: Secondary | ICD-10-CM | POA: Diagnosis not present

## 2024-07-27 DIAGNOSIS — Z23 Encounter for immunization: Secondary | ICD-10-CM

## 2024-07-27 DIAGNOSIS — Z125 Encounter for screening for malignant neoplasm of prostate: Secondary | ICD-10-CM | POA: Diagnosis not present

## 2024-07-27 LAB — CBC WITH DIFFERENTIAL/PLATELET
Basophils Absolute: 0 K/uL (ref 0.0–0.1)
Basophils Relative: 0.9 % (ref 0.0–3.0)
Eosinophils Absolute: 0.1 K/uL (ref 0.0–0.7)
Eosinophils Relative: 3 % (ref 0.0–5.0)
HCT: 46 % (ref 39.0–52.0)
Hemoglobin: 15.6 g/dL (ref 13.0–17.0)
Lymphocytes Relative: 27.9 % (ref 12.0–46.0)
Lymphs Abs: 1.4 K/uL (ref 0.7–4.0)
MCHC: 33.9 g/dL (ref 30.0–36.0)
MCV: 91.5 fl (ref 78.0–100.0)
Monocytes Absolute: 0.5 K/uL (ref 0.1–1.0)
Monocytes Relative: 9.7 % (ref 3.0–12.0)
Neutro Abs: 2.9 K/uL (ref 1.4–7.7)
Neutrophils Relative %: 58.5 % (ref 43.0–77.0)
Platelets: 236 K/uL (ref 150.0–400.0)
RBC: 5.03 Mil/uL (ref 4.22–5.81)
RDW: 13.1 % (ref 11.5–15.5)
WBC: 4.9 K/uL (ref 4.0–10.5)

## 2024-07-27 LAB — LIPID PANEL
Cholesterol: 192 mg/dL (ref 0–200)
HDL: 80.4 mg/dL (ref >39.00)
LDL Cholesterol: 88 mg/dL (ref 0–99)
NonHDL: 111.82
Total CHOL/HDL Ratio: 2
Triglycerides: 119 mg/dL (ref 0.0–149.0)
VLDL: 23.8 mg/dL (ref 0.0–40.0)

## 2024-07-27 LAB — TSH: TSH: 1.11 u[IU]/mL (ref 0.35–5.50)

## 2024-07-27 LAB — BASIC METABOLIC PANEL WITH GFR
BUN: 10 mg/dL (ref 6–23)
CO2: 30 meq/L (ref 19–32)
Calcium: 9.5 mg/dL (ref 8.4–10.5)
Chloride: 103 meq/L (ref 96–112)
Creatinine, Ser: 1.07 mg/dL (ref 0.40–1.50)
GFR: 75.38 mL/min (ref >60.00)
Glucose, Bld: 90 mg/dL (ref 70–99)
Potassium: 4.7 meq/L (ref 3.5–5.1)
Sodium: 141 meq/L (ref 135–145)

## 2024-07-27 LAB — HEPATIC FUNCTION PANEL
ALT: 16 U/L (ref 0–53)
AST: 20 U/L (ref 0–37)
Albumin: 4.9 g/dL (ref 3.5–5.2)
Alkaline Phosphatase: 51 U/L (ref 39–117)
Bilirubin, Direct: 0.1 mg/dL (ref 0.0–0.3)
Total Bilirubin: 0.8 mg/dL (ref 0.2–1.2)
Total Protein: 6.9 g/dL (ref 6.0–8.3)

## 2024-07-27 LAB — PSA: PSA: 0.63 ng/mL (ref 0.10–4.00)

## 2024-07-27 NOTE — Progress Notes (Signed)
   Subjective:    Patient ID: Blake Brown, male    DOB: March 22, 1966, 58 y.o.   MRN: [id number]  HPI CPE- UTD on colonoscopy, Tdap.  Will get flu today. Down 7 lbs since last visit.  Health Maintenance  Topic Date Due   Pneumococcal Vaccine: 50+ Years (1 of 1 - PCV) Never done   Influenza Vaccine  04/15/2024   COVID-19 Vaccine (4 - 2025-26 season) 05/16/2024   Colonoscopy  11/19/2025   DTaP/Tdap/Td (2 - Td or Tdap) 01/24/2027   Hepatitis B Vaccines 19-59 Average Risk  Completed   Hepatitis C Screening  Completed   HIV Screening  Completed   Zoster Vaccines- Shingrix   Completed   HPV VACCINES  Aged Out   Meningococcal B Vaccine  Aged Out    Patient Care Team    Relationship Specialty Notifications Start End  Mahlon Comer BRAVO, MD PCP - General Family Medicine  03/12/17   Kristie Lamprey, MD Consulting Physician Gastroenterology  03/12/17   [place] [facility]    07/17/23   [place]    07/17/23      Review of Systems Patient reports no vision/hearing changes, anorexia, fever ,adenopathy, persistant/recurrent hoarseness, swallowing issues, chest pain, palpitations, edema, persistant/recurrent cough, hemoptysis, dyspnea (rest,exertional, paroxysmal nocturnal), gastrointestinal  bleeding (melena, rectal bleeding), abdominal pain, excessive heart burn, GU symptoms (dysuria, hematuria, voiding/incontinence issues) syncope, focal weakness, memory loss, numbness & tingling, skin/hair/nail changes, depression, anxiety, abnormal bruising/bleeding, musculoskeletal symptoms/signs.   + 7 lb weight loss- eating less.    Objective:   Physical Exam General Appearance:    Alert, cooperative, no distress, appears stated age  Head:    Normocephalic, without obvious abnormality, atraumatic  Eyes:    PERRL, conjunctiva/corneas clear, EOM's intact both eyes       Ears:    Normal TM's and external ear canals, both ears  Nose:   Nares normal, septum  midline, mucosa normal, no drainage   or sinus tenderness  Throat:   Lips, mucosa, and tongue normal; teeth and gums normal  Neck:   Supple, symmetrical, trachea midline, no adenopathy;       thyroid :  No enlargement/tenderness/nodules  Back:     Symmetric, no curvature, ROM normal, no CVA tenderness  Lungs:     Clear to auscultation bilaterally, respirations unlabored  Chest wall:    No tenderness or deformity  Heart:    Regular rate and rhythm, S1 and S2 normal, no murmur, rub   or gallop  Abdomen:     Soft, non-tender, bowel sounds active all four quadrants,    no masses, no organomegaly  Genitalia:    deferred  Rectal:    Extremities:   Extremities normal, atraumatic, no cyanosis or edema  Pulses:   2+ and symmetric all extremities  Skin:   Skin color, texture, turgor normal, no rashes or lesions  Lymph nodes:   Cervical, supraclavicular, and axillary nodes normal  Neurologic:   CNII-XII intact. Normal strength, sensation and reflexes      throughout          Assessment & Plan:

## 2024-07-27 NOTE — Patient Instructions (Signed)
 Follow up in 1 year or as needed We'll notify you of your lab results and make any changes if needed Keep up the good work!  You look FANTASTIC!!! Call with any questions or concerns Stay safe! Stay healthy! Happy Holidays!!!

## 2024-07-27 NOTE — Assessment & Plan Note (Signed)
Pt's PE WNL.  UTD on colonoscopy.  Flu shot given today.  Check labs.  Anticipatory guidance provided.  

## 2025-07-28 ENCOUNTER — Encounter: Admitting: Family Medicine
